# Patient Record
Sex: Male | Born: 1970 | Hispanic: No
Health system: Southern US, Community
[De-identification: ages and names within clinical notes are randomized; demographics above are authoritative.]

## PROBLEM LIST (undated history)

## (undated) DIAGNOSIS — K219 Gastro-esophageal reflux disease without esophagitis: Secondary | ICD-10-CM

## (undated) DIAGNOSIS — I10 Essential (primary) hypertension: Secondary | ICD-10-CM

## (undated) DIAGNOSIS — G629 Polyneuropathy, unspecified: Secondary | ICD-10-CM

## (undated) DIAGNOSIS — E785 Hyperlipidemia, unspecified: Secondary | ICD-10-CM

## (undated) HISTORY — DX: Hyperlipidemia, unspecified: E78.5

## (undated) HISTORY — DX: Essential (primary) hypertension: I10

## (undated) HISTORY — DX: Polyneuropathy, unspecified: G62.9

## (undated) HISTORY — DX: Gastro-esophageal reflux disease without esophagitis: K21.9

---

## 1999-11-13 ENCOUNTER — Encounter: Payer: Self-pay | Admitting: Specialist

## 1999-11-13 ENCOUNTER — Encounter: Admission: RE | Admit: 1999-11-13 | Discharge: 1999-11-13 | Payer: Self-pay | Admitting: Specialist

## 2000-07-04 ENCOUNTER — Encounter: Payer: Self-pay | Admitting: Emergency Medicine

## 2000-07-04 ENCOUNTER — Emergency Department (HOSPITAL_COMMUNITY): Admission: EM | Admit: 2000-07-04 | Discharge: 2000-07-04 | Payer: Self-pay | Admitting: Emergency Medicine

## 2003-11-30 HISTORY — PX: KNEE SURGERY: SHX244

## 2004-09-22 ENCOUNTER — Encounter: Admission: RE | Admit: 2004-09-22 | Discharge: 2004-09-22 | Payer: Self-pay | Admitting: Internal Medicine

## 2004-12-22 ENCOUNTER — Ambulatory Visit: Payer: Self-pay | Admitting: Internal Medicine

## 2005-03-01 ENCOUNTER — Ambulatory Visit: Payer: Self-pay | Admitting: Internal Medicine

## 2007-04-07 ENCOUNTER — Ambulatory Visit: Payer: Self-pay | Admitting: Internal Medicine

## 2007-05-08 ENCOUNTER — Ambulatory Visit: Payer: Self-pay | Admitting: Internal Medicine

## 2007-05-29 DIAGNOSIS — I10 Essential (primary) hypertension: Secondary | ICD-10-CM | POA: Insufficient documentation

## 2007-06-29 ENCOUNTER — Ambulatory Visit: Payer: Self-pay | Admitting: Internal Medicine

## 2007-06-29 DIAGNOSIS — E119 Type 2 diabetes mellitus without complications: Secondary | ICD-10-CM | POA: Insufficient documentation

## 2007-06-29 DIAGNOSIS — F528 Other sexual dysfunction not due to a substance or known physiological condition: Secondary | ICD-10-CM | POA: Insufficient documentation

## 2007-06-29 DIAGNOSIS — E785 Hyperlipidemia, unspecified: Secondary | ICD-10-CM | POA: Insufficient documentation

## 2007-06-29 LAB — CONVERTED CEMR LAB
Cholesterol, target level: 200 mg/dL
HDL goal, serum: 40 mg/dL
LDL Goal: 100 mg/dL

## 2007-07-04 LAB — CONVERTED CEMR LAB
BUN: 9 mg/dL (ref 6–23)
CO2: 29 meq/L (ref 19–32)
Calcium: 9.7 mg/dL (ref 8.4–10.5)
Chloride: 102 meq/L (ref 96–112)
Cholesterol: 240 mg/dL (ref 0–200)
Creatinine, Ser: 0.9 mg/dL (ref 0.4–1.5)
GFR calc Af Amer: 123 mL/min
GFR calc non Af Amer: 101 mL/min
Glucose, Bld: 147 mg/dL — ABNORMAL HIGH (ref 70–99)
HDL: 35.3 mg/dL — ABNORMAL LOW (ref >39.0)
Hgb A1c MFr Bld: 8 % — ABNORMAL HIGH (ref 4.6–6.0)
Potassium: 4 meq/L (ref 3.5–5.1)
Sodium: 138 meq/L (ref 135–145)
Testosterone: 393.71 ng/dL (ref 350.00–890)
Total CHOL/HDL Ratio: 6.8
Triglycerides: 84 mg/dL (ref 0–149)
VLDL: 17 mg/dL (ref 0–40)

## 2007-07-24 ENCOUNTER — Encounter: Admission: RE | Admit: 2007-07-24 | Discharge: 2007-10-22 | Payer: Self-pay | Admitting: Internal Medicine

## 2007-07-24 ENCOUNTER — Encounter: Payer: Self-pay | Admitting: Internal Medicine

## 2007-08-28 ENCOUNTER — Encounter: Payer: Self-pay | Admitting: Internal Medicine

## 2008-05-20 ENCOUNTER — Encounter: Payer: Self-pay | Admitting: Internal Medicine

## 2008-06-17 ENCOUNTER — Telehealth: Payer: Self-pay | Admitting: Internal Medicine

## 2008-10-21 ENCOUNTER — Encounter: Payer: Self-pay | Admitting: Internal Medicine

## 2009-08-13 ENCOUNTER — Ambulatory Visit: Payer: Self-pay | Admitting: Internal Medicine

## 2009-08-18 LAB — CONVERTED CEMR LAB
ALT: 26 units/L (ref 0–53)
AST: 25 units/L (ref 0–37)
Albumin: 4.3 g/dL (ref 3.5–5.2)
Alkaline Phosphatase: 70 units/L (ref 39–117)
BUN: 15 mg/dL (ref 6–23)
Bilirubin, Direct: 0.2 mg/dL (ref 0.0–0.3)
CO2: 31 meq/L (ref 19–32)
Calcium: 9.7 mg/dL (ref 8.4–10.5)
Chloride: 101 meq/L (ref 96–112)
Cholesterol: 250 mg/dL — ABNORMAL HIGH (ref 0–200)
Creatinine, Ser: 0.8 mg/dL (ref 0.4–1.5)
Direct LDL: 183.7 mg/dL
GFR calc non Af Amer: 114.79 mL/min (ref >60)
Glucose, Bld: 150 mg/dL — ABNORMAL HIGH (ref 70–99)
HDL: 55.5 mg/dL (ref >39.00)
Hgb A1c MFr Bld: 7.1 % — ABNORMAL HIGH (ref 4.6–6.5)
Potassium: 4.3 meq/L (ref 3.5–5.1)
Sodium: 139 meq/L (ref 135–145)
Total Bilirubin: 0.9 mg/dL (ref 0.3–1.2)
Total CHOL/HDL Ratio: 5
Total Protein: 8.2 g/dL (ref 6.0–8.3)
Triglycerides: 103 mg/dL (ref 0.0–149.0)
VLDL: 20.6 mg/dL (ref 0.0–40.0)

## 2010-09-01 ENCOUNTER — Ambulatory Visit: Payer: Self-pay | Admitting: Internal Medicine

## 2010-09-07 LAB — CONVERTED CEMR LAB
ALT: 26 units/L (ref 0–53)
BUN: 16 mg/dL (ref 6–23)
CO2: 28 meq/L (ref 19–32)
Calcium: 9.5 mg/dL (ref 8.4–10.5)
Chloride: 102 meq/L (ref 96–112)
Cholesterol: 226 mg/dL — ABNORMAL HIGH (ref 0–200)
Creatinine, Ser: 0.9 mg/dL (ref 0.4–1.5)
Direct LDL: 166.9 mg/dL
GFR calc non Af Amer: 100.95 mL/min (ref >60)
Glucose, Bld: 133 mg/dL — ABNORMAL HIGH (ref 70–99)
HDL: 50.3 mg/dL (ref >39.00)
Hgb A1c MFr Bld: 7.6 % — ABNORMAL HIGH (ref 4.6–6.5)
Potassium: 3.9 meq/L (ref 3.5–5.1)
Sodium: 138 meq/L (ref 135–145)
Total CHOL/HDL Ratio: 4
Triglycerides: 120 mg/dL (ref 0.0–149.0)
VLDL: 24 mg/dL (ref 0.0–40.0)

## 2010-09-22 ENCOUNTER — Encounter: Payer: Self-pay | Admitting: Internal Medicine

## 2010-12-08 ENCOUNTER — Other Ambulatory Visit: Payer: Self-pay | Admitting: Internal Medicine

## 2010-12-08 ENCOUNTER — Ambulatory Visit
Admission: RE | Admit: 2010-12-08 | Discharge: 2010-12-08 | Payer: Self-pay | Source: Home / Self Care | Attending: Internal Medicine | Admitting: Internal Medicine

## 2010-12-08 LAB — BASIC METABOLIC PANEL
BUN: 19 mg/dL (ref 6–23)
CO2: 31 mEq/L (ref 19–32)
Calcium: 9.7 mg/dL (ref 8.4–10.5)
Chloride: 99 mEq/L (ref 96–112)
Creatinine, Ser: 0.9 mg/dL (ref 0.4–1.5)
GFR: 103.49 mL/min (ref >60.00)
Glucose, Bld: 172 mg/dL — ABNORMAL HIGH (ref 70–99)
Potassium: 4.2 mEq/L (ref 3.5–5.1)
Sodium: 139 mEq/L (ref 135–145)

## 2010-12-08 LAB — HEPATIC FUNCTION PANEL
ALT: 43 U/L (ref 0–53)
AST: 29 U/L (ref 0–37)
Albumin: 4.2 g/dL (ref 3.5–5.2)
Alkaline Phosphatase: 87 U/L (ref 39–117)
Bilirubin, Direct: 0.1 mg/dL (ref 0.0–0.3)
Total Bilirubin: 0.8 mg/dL (ref 0.3–1.2)
Total Protein: 7.5 g/dL (ref 6.0–8.3)

## 2010-12-08 LAB — LIPID PANEL
Cholesterol: 165 mg/dL (ref 0–200)
HDL: 48.8 mg/dL (ref >39.00)
LDL Cholesterol: 99 mg/dL (ref 0–99)
Total CHOL/HDL Ratio: 3
Triglycerides: 84 mg/dL (ref 0.0–149.0)
VLDL: 16.8 mg/dL (ref 0.0–40.0)

## 2010-12-08 LAB — HEMOGLOBIN A1C: Hgb A1c MFr Bld: 8.6 % — ABNORMAL HIGH (ref 4.6–6.5)

## 2010-12-23 ENCOUNTER — Ambulatory Visit
Admission: RE | Admit: 2010-12-23 | Discharge: 2010-12-23 | Payer: Self-pay | Source: Home / Self Care | Attending: Internal Medicine | Admitting: Internal Medicine

## 2010-12-29 NOTE — Letter (Signed)
Summary: Eye Exam/[facility]  [facility]   Imported By: Maryln Gottron 10/09/2010 13:28:57  _____________________________________________________________________  External Attachment:    Type:   Image     Comment:   External Document

## 2010-12-29 NOTE — Assessment & Plan Note (Signed)
Summary: fup dm//ccm   Vital Signs:  Patient profile:   40 year old male Weight:      201 pounds Temp:     98.9 degrees F oral BP sitting:   110 / 82  (left arm) Cuff size:   large  Vitals Entered By: Sid Falcon LPN (September 01, 2010 10:45 AM)  Allergies (verified): No Known Drug Allergies  Past History:  Past Medical History: Last updated: 06/29/2007 Hypertension Diabetes mellitus, type II Hyperlipidemia  Social History: Last updated: 06/29/2007 Married Never Smoked Regular exercise-no  Family History: father CAD - age 62, CABG deceased age 47 Mother- heart trouble/CAD sister with DM 1  Physical Exam  General:  the well-developed well-nourished male in no acute distress. Moderately overweight. HEENT exam atraumatic, normocephalic symmetric muscles are intact. Neck is supple without lymphadenopathy or thyromegaly. Chest good auscultation without increased work of breathing. Cardiac exam S1-S2 are regular. Abdominal exam bowel sounds, soft, nontender. Extremities there is no clubbing cyanosis or edema. Neurologic exam is alert and oriented.   Impression & Recommendations:  Problem # 1:  HYPERLIPIDEMIA (ICD-272.4)  Labs Reviewed: SGOT: 25 (08/13/2009)   SGPT: 26 (08/13/2009)  Lipid Goals: Chol Goal: 200 (06/29/2007)   HDL Goal: 40 (06/29/2007)   LDL Goal: 100 (06/29/2007)   TG Goal: 150 (06/29/2007)  Prior 10 Yr Risk Heart Disease: Not enough information (06/29/2007)   HDL:55.50 (08/13/2009), 35.3 (06/29/2007)  LDL:DEL (06/29/2007)  Chol:250 (08/13/2009), 240 (06/29/2007)  Trig:103.0 (08/13/2009), 84 (06/29/2007)  His updated medication list for this problem includes:    Simvastatin 20 Mg Tabs (Simvastatin) .Marland Kitchen... 1 by mouth at bedtime  Problem # 2:  DIABETES MELLITUS, TYPE II (ICD-250.00) needs f/u labs His updated medication list for this problem includes:    Lisinopril-hydrochlorothiazide 20-25 Mg Tabs (Lisinopril-hydrochlorothiazide) .Marland Kitchen... Take 1 tablet  by mouth once a day or as directed  Labs Reviewed: Creat: 0.8 (08/13/2009)    Reviewed HgBA1c results: 7.1 (08/13/2009)  8.0 (06/29/2007)     Problem # 3:  HYPERTENSION (ICD-401.9) controlled continue current medications  His updated medication list for this problem includes:    Lisinopril-hydrochlorothiazide 20-25 Mg Tabs (Lisinopril-hydrochlorothiazide) .Marland Kitchen... Take 1 tablet by mouth once a day or as directed  BP today: 110/82 Prior BP: 110/78 (08/13/2009)  Prior 10 Yr Risk Heart Disease: Not enough information (06/29/2007)  Labs Reviewed: K+: 4.3 (08/13/2009) Creat: : 0.8 (08/13/2009)   Chol: 250 (08/13/2009)   HDL: 55.50 (08/13/2009)   LDL: DEL (06/29/2007)   TG: 103.0 (08/13/2009)  Orders: Venipuncture (52841) Specimen Handling (32440)  Complete Medication List: 1)  Lisinopril-hydrochlorothiazide 20-25 Mg Tabs (Lisinopril-hydrochlorothiazide) .... Take 1 tablet by mouth once a day or as directed 2)  Vitamin C 500 Mg Tabs (Ascorbic acid) .... Take 1 once a day 3)  Vitamin B-12 250 Mcg Tabs (Cyanocobalamin) .... Once daily 4)  Diprolene Af 0.05 % Crea (Betamethasone dipropionate aug) .... Apply small amount to affected area two times a day as needed 5)  Simvastatin 20 Mg Tabs (Simvastatin) .Marland Kitchen.. 1 by mouth at bedtime  Other Orders: Admin 1st Vaccine ([address]) Flu Vaccine 70yrs + (53664) TLB-A1C / Hgb A1C (Glycohemoglobin) (83036-A1C) TLB-BMP (Basic Metabolic Panel-BMET) (80048-METABOL) TLB-Lipid Panel (80061-LIPID) TLB-ALT (SGPT) (84460-ALT)  Patient Instructions: 1)  Please schedule a follow-up appointment in 3 months. 2)  labs one week prior to visit 3)  lipids---272.4 4)  lfts-995.2 5)  bmet-995.2 6)  A1C-250.02 7)     Prescriptions: LISINOPRIL-HYDROCHLOROTHIAZIDE 20-25 MG  TABS (LISINOPRIL-HYDROCHLOROTHIAZIDE) Take 1 tablet by  mouth once a day or as directed  #90 x 3   Entered and Authorized by:   Birdie Sons MD   Signed by:   Birdie Sons MD on 09/01/2010    Method used:   Electronically to        [facility] [phone]* (retail)       [address]       [place], [place]  [address]       Ph: [phone]       Fax: [phone]   RxID:   [id number] SIMVASTATIN 20 MG TABS (SIMVASTATIN) 1 by mouth at bedtime  #90 x 3   Entered and Authorized by:   Birdie Sons MD   Signed by:   Birdie Sons MD on 09/01/2010   Method used:   Electronically to        [facility] [phone]* (retail)       [address]       [place], [place]  [address]       Ph: [phone]       Fax: [phone]   RxID:   [id number] SIMVASTATIN 20 MG TABS (SIMVASTATIN) 1 by mouth at bedtime  #90 x 3   Entered and Authorized by:   Birdie Sons MD   Signed by:   Birdie Sons MD on 09/01/2010   Method used:   Electronically to        [facility] [address] (retail)       [address]       [place], [place]  [address]       Ph: [phone]       Fax: [phone]   RxID:   [id number]      Flu Vaccine Consent Questions     Do you have a history of severe allergic reactions to this vaccine? no    Any prior history of allergic reactions to egg and/or gelatin? no    Do you have a sensitivity to the preservative Thimersol? no    Do you have a past history of Guillan-Barre Syndrome? no    Do you currently have an acute febrile illness? no    Have you ever had a severe reaction to latex? no    Vaccine information given and explained to patient? yes    Are you currently pregnant? no    Lot Number:[id number]   Exp Date:05/29/2011   Site Given  Left Deltoid IMlu

## 2010-12-31 NOTE — Assessment & Plan Note (Signed)
Summary: 3 MO ROV/CB/pt rsc from bmp/cjr   Vital Signs:  Patient profile:   40 year old male Weight:      213 pounds BMI:     28.99 Temp:     98.3 degrees F oral BP sitting:   114 / 82  (left arm) Cuff size:   large  Vitals Entered By: Alfred Levins, CMA (December 23, 2010 12:13 PM) CC: discuss labs   CC:  discuss labs.  Current Medications (verified): 1)  Lisinopril-Hydrochlorothiazide 20-25 Mg  Tabs (Lisinopril-Hydrochlorothiazide) .... Take 1 Tablet By Mouth Once A Day or As Directed 2)  Vitamin C 500 Mg Tabs (Ascorbic Acid) .... Take 1 Once A Day 3)  Diprolene Af 0.05 % Crea (Betamethasone Dipropionate Aug) .... Apply Small Amount To Affected Area Two Times A Day As Needed 4)  Simvastatin 20 Mg Tabs (Simvastatin) .Marland Kitchen.. 1 By Mouth At Bedtime  Allergies (verified): No Known Drug Allergies  Family History: father CAD - age 83, CABG deceased age 40 (from brain hemorrhage) Mother- heart trouble/CAD, borderline DM  sister with DM 1  Physical Exam  General:   moderately overweight male in no acute distress. HEENT exam atraumatic, normocephalic, neck supple. Chest clear to auscultation cardiac exam S1-S2 are regular. Abdominal exam abdomen sounds, soft, overweight. extretities no edema. Neurologic exam alert with a normal gait.   Impression & Recommendations:  Problem # 1:  DIABETES MELLITUS, TYPE II (ICD-250.00)  prolonged discussion regarding diabetes. Patient's A1c has gone up as would be expected with recent weight gain. A long discussion with him about diet, exercise, weight loss. The key to long-term success with weight loss. Will avoid medications for now. A 15 pound weight loss in the next 3 months. Total time with the patient over 45 minutes. Greater than have spent counseling regarding diabetes. His updated medication list for this problem includes:    Lisinopril-hydrochlorothiazide 20-25 Mg Tabs (Lisinopril-hydrochlorothiazide) .Marland Kitchen... Take 1 tablet by mouth once a day or  as directed  Labs Reviewed: Creat: 0.9 (12/08/2010)    Reviewed HgBA1c results: 8.6 (12/08/2010)  7.6 (09/01/2010)  Problem # 2:  HYPERLIPIDEMIA (ICD-272.4)  controlled continue current medications  His updated medication list for this problem includes:    Simvastatin 20 Mg Tabs (Simvastatin) .Marland Kitchen... 1 by mouth at bedtime  Labs Reviewed: SGOT: 29 (12/08/2010)   SGPT: 43 (12/08/2010)  Lipid Goals: Chol Goal: 200 (06/29/2007)   HDL Goal: 40 (06/29/2007)   LDL Goal: 100 (06/29/2007)   TG Goal: 150 (06/29/2007)  Prior 10 Yr Risk Heart Disease: Not enough information (06/29/2007)   HDL:48.80 (12/08/2010), 50.30 (09/01/2010)  LDL:99 (12/08/2010), DEL (14/43/1540)  Chol:165 (12/08/2010), 226 (09/01/2010)  Trig:84.0 (12/08/2010), 120.0 (09/01/2010)  Problem # 3:  HYPERTENSION (ICD-401.9) controlled continue current medications  His updated medication list for this problem includes:    Lisinopril-hydrochlorothiazide 20-25 Mg Tabs (Lisinopril-hydrochlorothiazide) .Marland Kitchen... Take 1 tablet by mouth once a day or as directed  BP today: 114/82 Prior BP: 110/82 (09/01/2010)  Prior 10 Yr Risk Heart Disease: Not enough information (06/29/2007)  Labs Reviewed: K+: 4.2 (12/08/2010) Creat: : 0.9 (12/08/2010)   Chol: 165 (12/08/2010)   HDL: 48.80 (12/08/2010)   LDL: 99 (12/08/2010)   TG: 84.0 (12/08/2010)  Complete Medication List: 1)  Lisinopril-hydrochlorothiazide 20-25 Mg Tabs (Lisinopril-hydrochlorothiazide) .... Take 1 tablet by mouth once a day or as directed 2)  Vitamin C 500 Mg Tabs (Ascorbic acid) .... Take 1 once a day 3)  Diprolene Af 0.05 % Crea (Betamethasone dipropionate aug) .... Apply  small amount to affected area two times a day as needed 4)  Simvastatin 20 Mg Tabs (Simvastatin) .Marland Kitchen.. 1 by mouth at bedtime  Patient Instructions: 1)  Please schedule a follow-up appointment in 3 months. 2)  labs one week prior to visit 3)  lipids---272.4 4)  lfts-995.2 5)  bmet-995.2 6)   A1C-250.02 7)     8)  It is important that you exercise regularly at least 40 minutes 5 times a week. If you develop chest pain, have severe difficulty breathing, or feel very tired , stop exercising immediately and seek medical attention.   Orders Added: 1)  Est. Patient Level V [16109]

## 2011-03-17 ENCOUNTER — Other Ambulatory Visit (INDEPENDENT_AMBULATORY_CARE_PROVIDER_SITE_OTHER): Payer: BC Managed Care – PPO | Admitting: Internal Medicine

## 2011-03-17 DIAGNOSIS — T887XXA Unspecified adverse effect of drug or medicament, initial encounter: Secondary | ICD-10-CM

## 2011-03-17 DIAGNOSIS — E785 Hyperlipidemia, unspecified: Secondary | ICD-10-CM

## 2011-03-17 DIAGNOSIS — IMO0001 Reserved for inherently not codable concepts without codable children: Secondary | ICD-10-CM

## 2011-03-17 LAB — LIPID PANEL
Cholesterol: 140 mg/dL (ref 0–200)
HDL: 49.9 mg/dL
LDL Cholesterol: 74 mg/dL (ref 0–99)
Total CHOL/HDL Ratio: 3
Triglycerides: 80 mg/dL (ref 0.0–149.0)
VLDL: 16 mg/dL (ref 0.0–40.0)

## 2011-03-17 LAB — BASIC METABOLIC PANEL WITH GFR
BUN: 16 mg/dL (ref 6–23)
CO2: 32 meq/L (ref 19–32)
Calcium: 9.6 mg/dL (ref 8.4–10.5)
Chloride: 103 meq/L (ref 96–112)
Creatinine, Ser: 0.9 mg/dL (ref 0.4–1.5)
GFR: 94.51 mL/min
Glucose, Bld: 136 mg/dL — ABNORMAL HIGH (ref 70–99)
Potassium: 4.2 meq/L (ref 3.5–5.1)
Sodium: 141 meq/L (ref 135–145)

## 2011-03-17 LAB — HEPATIC FUNCTION PANEL
ALT: 25 U/L (ref 0–53)
AST: 22 U/L (ref 0–37)
Alkaline Phosphatase: 72 U/L (ref 39–117)
Total Bilirubin: 0.7 mg/dL (ref 0.3–1.2)

## 2011-03-29 ENCOUNTER — Encounter: Payer: Self-pay | Admitting: Internal Medicine

## 2011-03-29 ENCOUNTER — Ambulatory Visit (INDEPENDENT_AMBULATORY_CARE_PROVIDER_SITE_OTHER): Payer: BC Managed Care – PPO | Admitting: Internal Medicine

## 2011-03-29 DIAGNOSIS — I10 Essential (primary) hypertension: Secondary | ICD-10-CM

## 2011-03-29 DIAGNOSIS — E119 Type 2 diabetes mellitus without complications: Secondary | ICD-10-CM

## 2011-03-29 DIAGNOSIS — E785 Hyperlipidemia, unspecified: Secondary | ICD-10-CM

## 2011-03-29 NOTE — Progress Notes (Signed)
  Subjective:    Patient ID: Danny Schroeder, male    DOB: 06/04/1971, 40 y.o.   MRN: [id number]  HPI   patient comes in for followup of multiple medical problems including type 2 diabetes, hyperlipidemia, hypertension. The patient does not check blood sugar or blood pressure at home. The patetient does not follow an exercise or diet program. The patient denies any polyuria, polydipsia.  In the past the patient has gone to diabetic treatment center. The patient is tolerating medications  Without difficulty. The patient does admit to medication compliance. Has not required DM meds recently He is trying to lose weight  Past Medical History  Diagnosis Date  . Hyperlipidemia   . Hypertension   . Diabetes mellitus    No past surgical history on file.  reports that he has never smoked. He does not have any smokeless tobacco history on file. His alcohol and drug histories not on file. family history includes Diabetes in his mother and sister; Heart disease in his mother; and Heart disease (age of onset:39) in his father. No Known Allergies   Review of Systems  patient denies chest pain, shortness of breath, orthopnea. Denies lower extremity edema, abdominal pain, change in appetite, change in bowel movements. Patient denies rashes, musculoskeletal complaints. No other specific complaints in a complete review of systems.      Objective:   Physical Exam  well-developed well-nourished male in no acute distress. HEENT exam atraumatic, normocephalic, neck supple without jugular venous distention. Chest clear to auscultation cardiac exam S1-S2 are regular. Abdominal exam overweight with bowel sounds, soft and nontender. Extremities no edema. Neurologic exam is alert with a normal gait.      Assessment & Plan:

## 2011-03-29 NOTE — Assessment & Plan Note (Signed)
Controlled Continue meds 

## 2011-03-29 NOTE — Assessment & Plan Note (Signed)
Improved Will continue to avoid meds He understands that he needs to lose about 20 pounds (at least) He may still end up on DM meds

## 2011-07-07 ENCOUNTER — Other Ambulatory Visit (INDEPENDENT_AMBULATORY_CARE_PROVIDER_SITE_OTHER): Payer: BC Managed Care – PPO

## 2011-07-07 DIAGNOSIS — I10 Essential (primary) hypertension: Secondary | ICD-10-CM

## 2011-07-07 DIAGNOSIS — E119 Type 2 diabetes mellitus without complications: Secondary | ICD-10-CM

## 2011-07-07 DIAGNOSIS — E785 Hyperlipidemia, unspecified: Secondary | ICD-10-CM

## 2011-07-07 LAB — BASIC METABOLIC PANEL
Calcium: 9.3 mg/dL (ref 8.4–10.5)
GFR: 97.97 mL/min (ref >60.00)
Sodium: 139 mEq/L (ref 135–145)

## 2011-07-07 LAB — LIPID PANEL
Cholesterol: 170 mg/dL (ref 0–200)
Total CHOL/HDL Ratio: 3
Triglycerides: 100 mg/dL (ref 0.0–149.0)

## 2011-07-07 LAB — HEMOGLOBIN A1C: Hgb A1c MFr Bld: 8.2 % — ABNORMAL HIGH (ref 4.6–6.5)

## 2011-07-07 LAB — HEPATIC FUNCTION PANEL
ALT: 27 U/L (ref 0–53)
AST: 23 U/L (ref 0–37)
Albumin: 4.2 g/dL (ref 3.5–5.2)
Alkaline Phosphatase: 64 U/L (ref 39–117)

## 2011-08-25 ENCOUNTER — Encounter: Payer: Self-pay | Admitting: Internal Medicine

## 2011-08-25 ENCOUNTER — Ambulatory Visit (INDEPENDENT_AMBULATORY_CARE_PROVIDER_SITE_OTHER): Payer: BC Managed Care – PPO | Admitting: Internal Medicine

## 2011-08-25 VITALS — BP 102/72 | HR 94 | Temp 98.4°F | Ht 72.0 in | Wt 208.0 lb

## 2011-08-25 DIAGNOSIS — Z0000 Encounter for general adult medical examination without abnormal findings: Secondary | ICD-10-CM

## 2011-08-25 DIAGNOSIS — Z23 Encounter for immunization: Secondary | ICD-10-CM

## 2011-08-25 DIAGNOSIS — E119 Type 2 diabetes mellitus without complications: Secondary | ICD-10-CM

## 2011-08-25 MED ORDER — METFORMIN HCL 500 MG PO TABS: 500.0000 mg | ORAL_TABLET | Freq: Two times a day (BID) | ORAL | Status: DC

## 2011-08-25 NOTE — Assessment & Plan Note (Signed)
Poor control. Patient also poor diet. He is exercising some. I told him he needs to lose 20-30 pounds. He should follow a low calorie, low carbohydrate diet. He should essentially eliminates simple carbohydrates from his diet. I will see him back in 4 months.

## 2011-08-25 NOTE — Progress Notes (Signed)
  Subjective:    Patient ID: Danny Schroeder, male    DOB: August 15, 1971, 40 y.o.   MRN: [id number]  HPI  Well visit  Past Medical History  Diagnosis Date  . Hyperlipidemia   . Hypertension   . Diabetes mellitus    No past surgical history on file.  reports that he has never smoked. He does not have any smokeless tobacco history on file. His alcohol and drug histories not on file. family history includes Diabetes in his mother and sister; Heart disease in his mother; and Heart disease (age of onset:39) in his father. No Known Allergies  Review of Systems  patient denies chest pain, shortness of breath, orthopnea. Denies lower extremity edema, abdominal pain, change in appetite, change in bowel movements. Patient denies rashes, musculoskeletal complaints. No other specific complaints in a complete review of systems.      Objective:   Physical Exam   well-developed well-nourished male in no acute distress. HEENT exam atraumatic, normocephalic, neck supple without jugular venous distention. Chest clear to auscultation cardiac exam S1-S2 are regular. Abdominal exam overweight with bowel sounds, soft and nontender. Extremities no edema. Neurologic exam is alert with a normal gait.     Assessment & Plan:  Well visit---health maint UTD

## 2011-09-06 ENCOUNTER — Other Ambulatory Visit: Payer: Self-pay | Admitting: *Deleted

## 2011-09-06 MED ORDER — SIMVASTATIN 20 MG PO TABS: 20.0000 mg | ORAL_TABLET | Freq: Every day | ORAL | Status: DC

## 2011-10-18 ENCOUNTER — Other Ambulatory Visit: Payer: Self-pay | Admitting: *Deleted

## 2011-10-18 MED ORDER — LISINOPRIL-HYDROCHLOROTHIAZIDE 20-25 MG PO TABS: 1.0000 | ORAL_TABLET | Freq: Every day | ORAL | Status: DC

## 2011-12-22 ENCOUNTER — Other Ambulatory Visit (INDEPENDENT_AMBULATORY_CARE_PROVIDER_SITE_OTHER): Payer: BC Managed Care – PPO

## 2011-12-22 DIAGNOSIS — E119 Type 2 diabetes mellitus without complications: Secondary | ICD-10-CM

## 2011-12-22 LAB — HEPATIC FUNCTION PANEL
Alkaline Phosphatase: 67 U/L (ref 39–117)
Bilirubin, Direct: 0.1 mg/dL (ref 0.0–0.3)
Total Protein: 7.6 g/dL (ref 6.0–8.3)

## 2011-12-22 LAB — BASIC METABOLIC PANEL
CO2: 29 mEq/L (ref 19–32)
Calcium: 9.6 mg/dL (ref 8.4–10.5)
Creatinine, Ser: 0.9 mg/dL (ref 0.4–1.5)

## 2011-12-22 LAB — LIPID PANEL
Cholesterol: 163 mg/dL (ref 0–200)
LDL Cholesterol: 99 mg/dL (ref 0–99)
Total CHOL/HDL Ratio: 3

## 2012-01-04 ENCOUNTER — Encounter: Payer: Self-pay | Admitting: Internal Medicine

## 2012-01-04 ENCOUNTER — Ambulatory Visit (INDEPENDENT_AMBULATORY_CARE_PROVIDER_SITE_OTHER): Payer: BC Managed Care – PPO | Admitting: Internal Medicine

## 2012-01-04 VITALS — BP 124/74 | HR 72 | Temp 98.3°F | Wt 211.0 lb

## 2012-01-04 DIAGNOSIS — IMO0002 Reserved for concepts with insufficient information to code with codable children: Secondary | ICD-10-CM

## 2012-01-04 DIAGNOSIS — E1165 Type 2 diabetes mellitus with hyperglycemia: Secondary | ICD-10-CM

## 2012-01-04 DIAGNOSIS — IMO0001 Reserved for inherently not codable concepts without codable children: Secondary | ICD-10-CM

## 2012-01-04 NOTE — Assessment & Plan Note (Signed)
DM still not adequately controlled. Needs to lose weight Exercise every day Low calorie diet.

## 2012-01-04 NOTE — Progress Notes (Signed)
Patient ID: Danny Schroeder, male   DOB: 27-May-1971, 41 y.o.   MRN: [id number]  patient comes in for followup of multiple medical problems including type 2 diabetes, hyperlipidemia, hypertension. The patient does not check blood sugar or blood pressure at home. The patetient does not follow an exercise or diet program. The patient denies any polyuria, polydipsia.  In the past the patient has gone to diabetic treatment center. The patient is tolerating medications  Without difficulty. The patient does admit to medication compliance.    Past Medical History  Diagnosis Date  . Hyperlipidemia   . Hypertension   . Diabetes mellitus     History   Social History  . Marital Status: Married    Spouse Name: N/A    Number of Children: N/A  . Years of Education: N/A   Occupational History  . Not on file.   Social History Main Topics  . Smoking status: Never Smoker   . Smokeless tobacco: Not on file  . Alcohol Use:   . Drug Use:   . Sexually Active:    Other Topics Concern  . Not on file   Social History Narrative  . No narrative on file    No past surgical history on file.  Family History  Problem Relation Age of Onset  . Heart disease Mother   . Diabetes Mother   . Heart disease Father 51    CABG  . Diabetes Sister     No Known Allergies  Current Outpatient Prescriptions on File Prior to Visit  Medication Sig Dispense Refill  . Ascorbic Acid (VITAMIN C) 500 MG tablet Take 500 mg by mouth daily.        Marland Kitchen lisinopril-hydrochlorothiazide (PRINZIDE,ZESTORETIC) 20-25 MG per tablet Take 1 tablet by mouth daily.  90 tablet  1  . metFORMIN (GLUCOPHAGE) 500 MG tablet Take 1 tablet (500 mg total) by mouth 2 (two) times daily with a meal.  180 tablet  3  . simvastatin (ZOCOR) 20 MG tablet Take 1 tablet (20 mg total) by mouth at bedtime.  90 tablet  3     patient denies chest pain, shortness of breath, orthopnea. Denies lower extremity edema, abdominal pain, change in appetite, change in  bowel movements. Patient denies rashes, musculoskeletal complaints. No other specific complaints in a complete review of systems.   BP 124/74  Pulse 72  Temp(Src) 98.3 F (36.8 C) (Oral)  Wt 211 lb (95.709 kg)  well-developed well-nourished male in no acute distress. HEENT exam atraumatic, normocephalic, neck supple without jugular venous distention. Chest clear to auscultation cardiac exam S1-S2 are regular. Abdominal exam overweight with bowel sounds, soft and nontender. Extremities no edema. Neurologic exam is alert with a normal gait.

## 2012-04-25 ENCOUNTER — Other Ambulatory Visit (INDEPENDENT_AMBULATORY_CARE_PROVIDER_SITE_OTHER): Payer: BC Managed Care – PPO

## 2012-04-25 ENCOUNTER — Other Ambulatory Visit: Payer: Self-pay | Admitting: *Deleted

## 2012-04-25 DIAGNOSIS — I10 Essential (primary) hypertension: Secondary | ICD-10-CM

## 2012-04-25 DIAGNOSIS — IMO0001 Reserved for inherently not codable concepts without codable children: Secondary | ICD-10-CM

## 2012-04-25 DIAGNOSIS — E1165 Type 2 diabetes mellitus with hyperglycemia: Secondary | ICD-10-CM

## 2012-04-25 DIAGNOSIS — IMO0002 Reserved for concepts with insufficient information to code with codable children: Secondary | ICD-10-CM

## 2012-04-25 LAB — BASIC METABOLIC PANEL
BUN: 15 mg/dL (ref 6–23)
CO2: 27 mEq/L (ref 19–32)
Chloride: 103 mEq/L (ref 96–112)
Creatinine, Ser: 0.9 mg/dL (ref 0.4–1.5)

## 2012-04-25 LAB — LIPID PANEL: Cholesterol: 164 mg/dL (ref 0–200)

## 2012-04-25 LAB — HEPATIC FUNCTION PANEL
ALT: 23 U/L (ref 0–53)
AST: 21 U/L (ref 0–37)
Albumin: 3.9 g/dL (ref 3.5–5.2)
Total Protein: 7.2 g/dL (ref 6.0–8.3)

## 2012-04-25 MED ORDER — LISINOPRIL-HYDROCHLOROTHIAZIDE 20-25 MG PO TABS: 1.0000 | ORAL_TABLET | Freq: Every day | ORAL | Status: DC

## 2012-04-28 ENCOUNTER — Encounter: Payer: Self-pay | Admitting: Internal Medicine

## 2012-04-28 ENCOUNTER — Ambulatory Visit (INDEPENDENT_AMBULATORY_CARE_PROVIDER_SITE_OTHER): Payer: BC Managed Care – PPO | Admitting: Internal Medicine

## 2012-04-28 ENCOUNTER — Telehealth: Payer: Self-pay

## 2012-04-28 VITALS — BP 110/70 | Temp 98.0°F | Wt 205.0 lb

## 2012-04-28 DIAGNOSIS — I10 Essential (primary) hypertension: Secondary | ICD-10-CM

## 2012-04-28 DIAGNOSIS — IMO0002 Reserved for concepts with insufficient information to code with codable children: Secondary | ICD-10-CM

## 2012-04-28 DIAGNOSIS — E1165 Type 2 diabetes mellitus with hyperglycemia: Secondary | ICD-10-CM

## 2012-04-28 DIAGNOSIS — IMO0001 Reserved for inherently not codable concepts without codable children: Secondary | ICD-10-CM

## 2012-04-28 MED ORDER — METFORMIN HCL 1000 MG PO TABS: 1000.0000 mg | ORAL_TABLET | Freq: Two times a day (BID) | ORAL | Status: DC

## 2012-04-28 NOTE — Progress Notes (Signed)
Subjective:    Patient ID: Danny Schroeder, male    DOB: [birth year], 41 y.o.   MRN: [id number]  HPI 41 year old patient who has a history of treated hypertension and diabetes. He was well until yesterday when he had the onset of cold sweats weakness and a general sense of unwellness. Blood sugars were slightly higher yesterday with a fasting blood sugar 130 and postprandial sugars in the 190-200 range. He noted his blood pressure to be in a low-normal range. No fever He is had recent laboratory studies that were reviewed. Hemoglobin A1c still 7.5. He is scheduled for followup in 4 days. Today he feels much improved  Past Medical History  Diagnosis Date  . Hyperlipidemia   . Hypertension   . Diabetes mellitus     History   Social History  . Marital Status: Married    Spouse Name: N/A    Number of Children: N/A  . Years of Education: N/A   Occupational History  . Not on file.   Social History Main Topics  . Smoking status: Never Smoker   . Smokeless tobacco: Not on file  . Alcohol Use:   . Drug Use:   . Sexually Active:    Other Topics Concern  . Not on file   Social History Narrative  . No narrative on file    No past surgical history on file.  Family History  Problem Relation Age of Onset  . Heart disease Mother   . Diabetes Mother   . Heart disease Father 23    CABG  . Diabetes Sister     No Known Allergies  Current Outpatient Prescriptions on File Prior to Visit  Medication Sig Dispense Refill  . Ascorbic Acid (VITAMIN C) 500 MG tablet Take 500 mg by mouth daily.        Marland Kitchen lisinopril-hydrochlorothiazide (PRINZIDE,ZESTORETIC) 20-25 MG per tablet Take 1 tablet by mouth daily.  90 tablet  1  . simvastatin (ZOCOR) 20 MG tablet Take 1 tablet (20 mg total) by mouth at bedtime.  90 tablet  3    BP 110/70  Temp(Src) 98 F (36.7 C) (Oral)  Wt 205 lb (92.987 kg)  SpO2 98%     Review of Systems  Constitutional: Positive for chills, activity change, appetite change  and fatigue. Negative for fever.  HENT: Negative for hearing loss, ear pain, congestion, sore throat, trouble swallowing, neck stiffness, dental problem, voice change and tinnitus.   Eyes: Negative for pain, discharge and visual disturbance.  Respiratory: Negative for cough, chest tightness, wheezing and stridor.   Cardiovascular: Negative for chest pain, palpitations and leg swelling.  Gastrointestinal: Negative for nausea, vomiting, abdominal pain, diarrhea, constipation, blood in stool and abdominal distention.  Genitourinary: Negative for urgency, hematuria, flank pain, discharge, difficulty urinating and genital sores.  Musculoskeletal: Negative for myalgias, back pain, joint swelling, arthralgias and gait problem.  Skin: Negative for rash.  Neurological: Positive for weakness. Negative for dizziness, syncope, speech difficulty, numbness and headaches.  Hematological: Negative for adenopathy. Does not bruise/bleed easily.  Psychiatric/Behavioral: Negative for behavioral problems and dysphoric mood. The patient is not nervous/anxious.        Objective:   Physical Exam  Constitutional: He is oriented to person, place, and time. He appears well-developed.       Blood pressure 110/80 without orthostatic change  HENT:  Head: Normocephalic.  Right Ear: External ear normal.  Left Ear: External ear normal.  Eyes: Conjunctivae and EOM are normal.  Neck: Normal range of  motion.  Cardiovascular: Normal rate and normal heart sounds.   Pulmonary/Chest: Breath sounds normal.  Abdominal: Bowel sounds are normal.  Musculoskeletal: Normal range of motion. He exhibits no edema and no tenderness.  Neurological: He is alert and oriented to person, place, and time.  Psychiatric: He has a normal mood and affect. His behavior is normal.          Assessment & Plan:   Probable viral syndrome. Today clinically is improved. Doubt significant hypotension is contributing  to his symptoms. Will hold  blood pressure medication tonight and resume on a normal schedule tomorrow. Will observe over the weekend. Hypertension well controlled Diabetes mellitus suboptimal control. He is tolerating metformin well without any GI issues. We'll increase metformin to 2 g daily in divided dosages.  Followup in 4 days as planned

## 2012-04-28 NOTE — Telephone Encounter (Signed)
appt scheduled with Dr K 

## 2012-04-28 NOTE — Telephone Encounter (Signed)
Patient called stating he didn't feel that good yesterday. Pt stated he had "cold sweats" yesterday slept for a few hours and felt better, he then went back to sleep for 6 hours and feels a little better but still not normal. Pt states his BP 102/70, 103/70, 101/70. His BS fasting was 130. Pt also stated he feels light headed. Patient would like to speak to Dr Cato Mulligan nurse. Pt states you can call home or cell

## 2012-04-28 NOTE — Patient Instructions (Addendum)
Office followup next week as planned Hold blood pressure medication today

## 2012-05-02 ENCOUNTER — Ambulatory Visit (INDEPENDENT_AMBULATORY_CARE_PROVIDER_SITE_OTHER): Payer: BC Managed Care – PPO | Admitting: Internal Medicine

## 2012-05-02 ENCOUNTER — Ambulatory Visit: Payer: BC Managed Care – PPO | Admitting: Internal Medicine

## 2012-05-02 ENCOUNTER — Encounter: Payer: Self-pay | Admitting: Internal Medicine

## 2012-05-02 VITALS — BP 114/84 | HR 68 | Temp 97.8°F | Wt 208.0 lb

## 2012-05-02 DIAGNOSIS — I10 Essential (primary) hypertension: Secondary | ICD-10-CM

## 2012-05-02 DIAGNOSIS — IMO0001 Reserved for inherently not codable concepts without codable children: Secondary | ICD-10-CM

## 2012-05-02 DIAGNOSIS — E785 Hyperlipidemia, unspecified: Secondary | ICD-10-CM

## 2012-05-02 DIAGNOSIS — E1165 Type 2 diabetes mellitus with hyperglycemia: Secondary | ICD-10-CM

## 2012-05-02 DIAGNOSIS — IMO0002 Reserved for concepts with insufficient information to code with codable children: Secondary | ICD-10-CM

## 2012-05-02 NOTE — Progress Notes (Signed)
Patient ID: Danny Schroeder, male   DOB: 1971/10/26, 41 y.o.   MRN: [id number]  patient comes in for followup of multiple medical problems including type 2 diabetes, hyperlipidemia, hypertension. The patient does not check blood sugar or blood pressure at home. The patetient does not follow an exercise or diet program. The patient denies any polyuria, polydipsia.  In the past the patient has gone to diabetic treatment center. The patient is tolerating medications  Without difficulty. The patient does admit to medication compliance.   Reviewed note from Friday--- he is feeling much better but he is not taking lisinopril currently.  Past Medical History  Diagnosis Date  . Hyperlipidemia   . Hypertension   . Diabetes mellitus     History   Social History  . Marital Status: Married    Spouse Name: N/A    Number of Children: N/A  . Years of Education: N/A   Occupational History  . Not on file.   Social History Main Topics  . Smoking status: Never Smoker   . Smokeless tobacco: Not on file  . Alcohol Use:   . Drug Use:   . Sexually Active:    Other Topics Concern  . Not on file   Social History Narrative  . No narrative on file    No past surgical history on file.  Family History  Problem Relation Age of Onset  . Heart disease Mother   . Diabetes Mother   . Heart disease Father 60    CABG  . Diabetes Sister     No Known Allergies  Current Outpatient Prescriptions on File Prior to Visit  Medication Sig Dispense Refill  . lisinopril-hydrochlorothiazide (PRINZIDE,ZESTORETIC) 20-25 MG per tablet Take 1 tablet by mouth daily.  90 tablet  1  . simvastatin (ZOCOR) 20 MG tablet Take 1 tablet (20 mg total) by mouth at bedtime.  90 tablet  3     patient denies chest pain, shortness of breath, orthopnea. Denies lower extremity edema, abdominal pain, change in appetite, change in bowel movements. Patient denies rashes, musculoskeletal complaints. No other specific complaints in a  complete review of systems.   BP 114/84  Pulse 68  Temp(Src) 97.8 F (36.6 C) (Oral)  Wt 208 lb (94.348 kg)  well-developed well-nourished male in no acute distress. HEENT exam atraumatic, normocephalic, neck supple without jugular venous distention. Chest clear to auscultation cardiac exam S1-S2 are regular. Abdominal exam overweight with bowel sounds, soft and nontender. Extremities no edema. Neurologic exam is alert with a normal gait.

## 2012-05-02 NOTE — Assessment & Plan Note (Signed)
Reasonable control  Continue meds 

## 2012-05-02 NOTE — Assessment & Plan Note (Signed)
He is not taking lisinopril---  bp well controlled currently I'll remove lisinopril from list

## 2012-05-02 NOTE — Assessment & Plan Note (Addendum)
Not as well controlled Metformin increased last Friday Continue the same  Discussed his ongoing risk of DM. He needs to lose weight, he needs to exercise. Initial goal weight<190 pounds

## 2012-05-24 ENCOUNTER — Telehealth: Payer: Self-pay | Admitting: Internal Medicine

## 2012-05-24 MED ORDER — LISINOPRIL 10 MG PO TABS: 10.0000 mg | ORAL_TABLET | Freq: Every day | ORAL | Status: DC

## 2012-05-24 NOTE — Telephone Encounter (Signed)
Caller: Ralphael/Patient; PCP: Birdie Sons; CB#: [phone]; Calling today 05/24/12 regarding Blood Pressure Issues; Was in office on 05/02/12 and at that MD took him off his blood pressure medicine b/c his BP was running to low.  Since that time, his BP has been starting to fluctuate.  If he goes 3-4 without his pill, his BP goes up.  Feels tightening in back of neck and head starts hurting.  After he takes his pill, his BP comes back to normal.  Yesterday BP went to 137/90, took BP med and was brought to 117/72, this AM 117/74.  Wants to know if MD wants to put him on a lower dose of his medication, something that he can take everyday.  Has been taking Lisinopril 25 mg for many years but then his body started not tolerating it and BP was going to low.  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  Please call something [facility] to [facility].  CALL PT BACK AT [phone] TO LET HIM KNOW THIS HAS BEEN HANDLED.

## 2012-05-24 NOTE — Telephone Encounter (Signed)
Pt informed and med sent in 

## 2012-05-24 NOTE — Telephone Encounter (Signed)
He was on a base drug with a diuretic I would recommend that he just go on Prinivil 10 mg by mouth daily The diuretic his platelets dropping his blood pressures are significantly

## 2012-09-19 ENCOUNTER — Other Ambulatory Visit: Payer: Self-pay | Admitting: *Deleted

## 2012-09-19 MED ORDER — SIMVASTATIN 20 MG PO TABS: 20.0000 mg | ORAL_TABLET | Freq: Every day | ORAL | Status: DC

## 2012-10-11 ENCOUNTER — Telehealth: Payer: Self-pay | Admitting: Internal Medicine

## 2012-10-11 DIAGNOSIS — Z0000 Encounter for general adult medical examination without abnormal findings: Secondary | ICD-10-CM

## 2012-10-11 NOTE — Telephone Encounter (Signed)
Scheduled for labs, but no labs are ordered- pt dx-dm,hyperlipidemia, hypertension and he wants ua

## 2012-10-11 NOTE — Telephone Encounter (Signed)
Pt is sch for labs 11/06/12 and is req to get a ua added to lab order. Pls order the req lab.

## 2012-10-11 NOTE — Telephone Encounter (Signed)
Lipid, lft, bmet, a1c, ua and urine microalbuminuria

## 2012-10-11 NOTE — Telephone Encounter (Signed)
Order placed

## 2012-11-06 ENCOUNTER — Other Ambulatory Visit (INDEPENDENT_AMBULATORY_CARE_PROVIDER_SITE_OTHER): Payer: BC Managed Care – PPO

## 2012-11-06 DIAGNOSIS — IMO0001 Reserved for inherently not codable concepts without codable children: Secondary | ICD-10-CM

## 2012-11-06 DIAGNOSIS — E1165 Type 2 diabetes mellitus with hyperglycemia: Secondary | ICD-10-CM

## 2012-11-06 DIAGNOSIS — Z0000 Encounter for general adult medical examination without abnormal findings: Secondary | ICD-10-CM

## 2012-11-06 DIAGNOSIS — IMO0002 Reserved for concepts with insufficient information to code with codable children: Secondary | ICD-10-CM

## 2012-11-06 LAB — BASIC METABOLIC PANEL
BUN: 18 mg/dL (ref 6–23)
Chloride: 99 mEq/L (ref 96–112)
Glucose, Bld: 140 mg/dL — ABNORMAL HIGH (ref 70–99)
Potassium: 3.8 mEq/L (ref 3.5–5.1)

## 2012-11-06 LAB — HEPATIC FUNCTION PANEL
ALT: 26 U/L (ref 0–53)
Albumin: 4.2 g/dL (ref 3.5–5.2)
Total Bilirubin: 0.9 mg/dL (ref 0.3–1.2)

## 2012-11-06 LAB — MICROALBUMIN / CREATININE URINE RATIO
Microalb Creat Ratio: 0.5 mg/g (ref 0.0–30.0)
Microalb, Ur: 1.4 mg/dL (ref 0.0–1.9)

## 2012-11-06 LAB — LIPID PANEL
HDL: 49 mg/dL (ref >39.00)
Total CHOL/HDL Ratio: 3
VLDL: 15.4 mg/dL (ref 0.0–40.0)

## 2012-11-07 ENCOUNTER — Other Ambulatory Visit: Payer: BC Managed Care – PPO

## 2012-11-13 ENCOUNTER — Encounter: Payer: Self-pay | Admitting: Internal Medicine

## 2012-11-13 ENCOUNTER — Ambulatory Visit (INDEPENDENT_AMBULATORY_CARE_PROVIDER_SITE_OTHER): Payer: BC Managed Care – PPO | Admitting: Internal Medicine

## 2012-11-13 VITALS — BP 134/90 | HR 72 | Temp 98.4°F | Wt 208.0 lb

## 2012-11-13 DIAGNOSIS — Z23 Encounter for immunization: Secondary | ICD-10-CM

## 2012-11-14 NOTE — Progress Notes (Signed)
  Subjective:    Patient ID: Danny Schroeder, male    DOB: 07-04-1971, 41 y.o.   MRN: [id number]  HPI    Review of Systems     Objective:   Physical Exam        Assessment & Plan:  Scanned progress note due to unexpected downtime

## 2013-01-08 ENCOUNTER — Encounter: Payer: Self-pay | Admitting: Family Medicine

## 2013-01-08 ENCOUNTER — Ambulatory Visit (INDEPENDENT_AMBULATORY_CARE_PROVIDER_SITE_OTHER): Payer: BC Managed Care – PPO | Admitting: Family Medicine

## 2013-01-08 ENCOUNTER — Ambulatory Visit: Payer: BC Managed Care – PPO | Admitting: Family

## 2013-01-08 VITALS — BP 142/99 | HR 74 | Temp 98.4°F | Ht 72.0 in | Wt 210.0 lb

## 2013-01-08 DIAGNOSIS — M7918 Myalgia, other site: Secondary | ICD-10-CM

## 2013-01-08 DIAGNOSIS — IMO0001 Reserved for inherently not codable concepts without codable children: Secondary | ICD-10-CM

## 2013-01-08 NOTE — Progress Notes (Signed)
OFFICE NOTE  01/08/2013  CC:  Chief Complaint  Patient presents with  . Shoulder Pain    left x 5-6 weeks; no known injury; using heat, massage and ibuprofen     HPI: Patient is a 42 y.o. middle [place] male who is here for left shoulder pain.   Onset about 5-6 wks ago, not getting better, worse with picking up wt and moving arm in certain directions. Advil, massage, heating pad brings some temporary relief.  Feels like it is in trapezius/suprascapular region.   No radiculopathy sx's.   No preceding trauma or strain.  Neck movements don't affect it.  No hx of similar problem.  Pertinent PMH:  Past Medical History  Diagnosis Date  . Hyperlipidemia   . Hypertension   . Diabetes mellitus    History reviewed. No pertinent past surgical history.  MEDS:  Outpatient Prescriptions Prior to Visit  Medication Sig Dispense Refill  . lisinopril (PRINIVIL,ZESTRIL) 10 MG tablet Take 1 tablet (10 mg total) by mouth daily.  90 tablet  3  . simvastatin (ZOCOR) 20 MG tablet Take 1 tablet (20 mg total) by mouth at bedtime.  90 tablet  1  . metFORMIN (GLUCOPHAGE) 500 MG tablet Take 1,000 mg by mouth 2 (two) times daily with a meal.       No facility-administered medications prior to visit.    PE: Blood pressure 142/99, pulse 74, temperature 98.4 F (36.9 C), temperature source Temporal, height 6' (1.829 m), weight 210 lb (95.255 kg), SpO2 100.00%. Gen: Alert, well appearing.  Patient is oriented to person, place, time, and situation. Neck: nontender, full ROM without stiffness.   He has tenderness diffusely over the left upper trapezius region down over the entire left scapula.  No paraspinous muscle tenderness or spasm, no midline spinal tenderness.  Shoulder ROM intact without shoulder pain, but resisted abd and flex/ext bring out a bit of the pain in the previously described back muscle regions.  No subacromial or AC jt tenderness.  UE strength 5/5 prox/dist bilat.  DTRs 2+ in biceps/triceps  bilat.   Spurling's test negative  IMPRESSION AND PLAN:  Myofascial pain No trigger point is present. Rx'd Dermatran myofascial pain compounded cream: 2 g qid prn. Massage, heat, stretching, relative rest emphasized. If no signif improvement in 7-10d then needs PT referral.   An After Visit Summary was printed and given to the patient.  FOLLOW UP: prn

## 2013-01-11 DIAGNOSIS — M7918 Myalgia, other site: Secondary | ICD-10-CM | POA: Insufficient documentation

## 2013-01-11 NOTE — Assessment & Plan Note (Signed)
No trigger point is present. Rx'd Dermatran myofascial pain compounded cream: 2 g qid prn. Massage, heat, stretching, relative rest emphasized. If no signif improvement in 7-10d then needs PT referral.

## 2013-01-23 ENCOUNTER — Emergency Department
Admission: EM | Admit: 2013-01-23 | Discharge: 2013-01-23 | Disposition: A | Discharge status: Home / Self Care | Payer: BC Managed Care – PPO | Source: Home / Self Care | Attending: Family Medicine | Admitting: Family Medicine

## 2013-01-23 ENCOUNTER — Encounter: Payer: Self-pay | Admitting: *Deleted

## 2013-01-23 ENCOUNTER — Emergency Department (INDEPENDENT_AMBULATORY_CARE_PROVIDER_SITE_OTHER): Payer: BC Managed Care – PPO

## 2013-01-23 DIAGNOSIS — M545 Low back pain, unspecified: Secondary | ICD-10-CM

## 2013-01-23 MED ORDER — MELOXICAM 15 MG PO TABS: 15.0000 mg | ORAL_TABLET | Freq: Every day | ORAL | Status: DC

## 2013-01-23 MED ORDER — HYDROCODONE-ACETAMINOPHEN 5-325 MG PO TABS: ORAL_TABLET | ORAL | Status: DC

## 2013-01-23 MED ORDER — KETOROLAC TROMETHAMINE 60 MG/2ML IM SOLN
60.0000 mg | Freq: Once | INTRAMUSCULAR | Status: AC
  Administered 2013-01-23: 60 mg via INTRAMUSCULAR

## 2013-01-23 NOTE — ED Notes (Signed)
Pt c/o LT side low back pain x last night after bending over.  He took 3 advil last night.

## 2013-01-23 NOTE — ED Provider Notes (Signed)
History     CSN: [id number]  Arrival date & time 01/23/13  4098   None     Chief Complaint  Patient presents with  . Back Pain       HPI Comments: At 8PM yesterday patient bent over forward and suddenly experienced a sharp pain in his left lower back.  The pain has persisted and is worse with movement and cough.  No bowel or bladder dysfunction.  No saddle numbness.  The pain radiates intermittently to his left thigh.  Patient is a 42 y.o. male presenting with back pain. The history is provided by the patient.  Back Pain Location:  Sacro-iliac joint Quality:  Shooting Radiates to:  L thigh Pain severity:  Moderate Pain is:  Same all the time Onset quality:  Sudden Timing:  Constant Progression:  Unchanged Chronicity:  New Context comment:  Bending over Relieved by:  Nothing Worsened by:  Coughing and movement Ineffective treatments:  Heating pad Associated symptoms: no abdominal pain, no bladder incontinence, no bowel incontinence, no dysuria, no fever, no leg pain, no numbness, no paresthesias, no perianal numbness, no tingling, no weakness and no weight loss     Past Medical History  Diagnosis Date  . Hyperlipidemia   . Hypertension   . Diabetes mellitus     Past Surgical History  Procedure Laterality Date  . Knee surgery      Family History  Problem Relation Age of Onset  . Heart disease Mother   . Diabetes Mother   . Hypertension Mother   . Heart disease Father 84    CABG  . Diabetes Sister   . Hypertension Sister     History  Substance Use Topics  . Smoking status: Former Games developer  . Smokeless tobacco: Never Used  . Alcohol Use: No      Review of Systems  Constitutional: Negative for fever and weight loss.  Gastrointestinal: Negative for abdominal pain and bowel incontinence.  Genitourinary: Negative for bladder incontinence and dysuria.  Musculoskeletal: Positive for back pain.  Neurological: Negative for tingling, weakness, numbness and  paresthesias.  All other systems reviewed and are negative.    Allergies  Review of patient's allergies indicates no known allergies.  Home Medications   Current Outpatient Rx  Name  Route  Sig  Dispense  Refill  . famotidine (PEPCID AC) 10 MG chewable tablet   Oral   Chew 10 mg by mouth 2 (two) times daily.         Marland Kitchen HYDROcodone-acetaminophen (NORCO/VICODIN) 5-325 MG per tablet      Take one by mouth at bedtime as needed for pain   10 tablet   0   . lisinopril (PRINIVIL,ZESTRIL) 10 MG tablet   Oral   Take 1 tablet (10 mg total) by mouth daily.   90 tablet   3   . meloxicam (MOBIC) 15 MG tablet   Oral   Take 1 tablet (15 mg total) by mouth daily. Take with food each morning   15 tablet   0   . metFORMIN (GLUCOPHAGE) 1000 MG tablet   Oral   Take 1,000 mg by mouth 2 (two) times daily with a meal.         . simvastatin (ZOCOR) 20 MG tablet   Oral   Take 1 tablet (20 mg total) by mouth at bedtime.   90 tablet   1     BP 143/90  Pulse 76  Temp(Src) 97.7 F (36.5 C) (Oral)  Ht  6' (1.829 m)  Wt 207 lb (93.895 kg)  BMI 28.07 kg/m2  SpO2 100%  Physical Exam Nursing notes and Vital Signs reviewed. Appearance:  Patient appears healthy, stated age, and in no acute distress Eyes:  Pupils are equal, round, and reactive to light and accomodation.  Extraocular movement is intact.  Conjunctivae are not inflamed  Pharynx:  Normal Neck:  Supple.  No adenopathy Lungs:  Clear to auscultation.  Breath sounds are equal.  Heart:  Regular rate and rhythm without murmurs, rubs, or gallops.  Abdomen:  Nontender without masses or hepatosplenomegaly.  Bowel sounds are present.  No CVA or flank tenderness.  Extremities:  No edema.  No calf tenderness Skin:  No rash present.  Back:   Can heel/toe walk and squat without difficulty.  Decreased forward flexion.  No distinct tenderness to palpation althouth patient points to his left sacral area.   Straight leg raising test is  negative.  Sitting knee extension test is negative.  Strength and sensation in the lower extremities is normal.  Patellar and achilles reflexes are normal   ED Course  Procedures  none   Dg Lumbar Spine 2-3 Views  01/23/2013  *RADIOLOGY REPORT*  Clinical Data:  Acute onset low back pain with radicular symptoms  LUMBAR SPINE - 2-3 VIEW  Comparison: None.  Findings:  Frontal, lateral, and spot lumbosacral lateral images were obtained.  There are five non-rib bearing lumbar type vertebral bodies.  There is no fracture or spondylolisthesis.  Disc spaces appear intact.  No erosive change.  IMPRESSION: No abnormality noted.   Original Report Authenticated By: Bretta Bang, M.D.      1. Acute low back pain       MDM  Toradol 60mg  IM Begin Mobic.  Lortab for night pain. Begin applying ice pack to left lower back for 15 to 20 minutes, 3 to 4 times daily.  Begin back exercises as tolerated ([facility] information and instruction handout given)  Followup with [facility] if not improving about two weeks.         Lattie Haw, MD 01/24/13 2032

## 2013-02-07 ENCOUNTER — Telehealth: Payer: Self-pay | Admitting: *Deleted

## 2013-02-07 DIAGNOSIS — K219 Gastro-esophageal reflux disease without esophagitis: Secondary | ICD-10-CM

## 2013-02-07 NOTE — Telephone Encounter (Signed)
Refer to gi for persistent GERD Ok to take otc prilosec 20 mg po qd until sees gi

## 2013-02-07 NOTE — Telephone Encounter (Signed)
Pt was taking heartburn medicine that you gave him a rx for.  Dr Cato Mulligan told him to take for 30 days and if it came back then we need to do something else.  Pt actually took it for 60 days and stopped.  The heartburn was not there when he was on med but now its back.  I don't see anything on med list that would require prescribing.  Can you advise what you would like for him to do now?

## 2013-02-08 NOTE — Telephone Encounter (Signed)
Pt aware, referral order placed 

## 2013-02-11 ENCOUNTER — Other Ambulatory Visit: Payer: Self-pay | Admitting: Internal Medicine

## 2013-02-14 ENCOUNTER — Encounter: Payer: Self-pay | Admitting: Internal Medicine

## 2013-03-01 ENCOUNTER — Encounter: Payer: Self-pay | Admitting: Internal Medicine

## 2013-03-06 ENCOUNTER — Encounter: Payer: Self-pay | Admitting: Internal Medicine

## 2013-03-06 ENCOUNTER — Ambulatory Visit (INDEPENDENT_AMBULATORY_CARE_PROVIDER_SITE_OTHER): Payer: BC Managed Care – PPO | Admitting: Internal Medicine

## 2013-03-06 VITALS — BP 132/90 | HR 82 | Ht 72.0 in | Wt 212.0 lb

## 2013-03-06 DIAGNOSIS — K219 Gastro-esophageal reflux disease without esophagitis: Secondary | ICD-10-CM

## 2013-03-06 MED ORDER — FAMOTIDINE 10 MG PO CHEW: 20.0000 mg | CHEWABLE_TABLET | Freq: Every evening | ORAL | Status: DC | PRN

## 2013-03-06 NOTE — Patient Instructions (Addendum)
We have sent the following medications to your pharmacy for you to pick up at your convenience: pepcid; please take as directed         Diet for Gastroesophageal Reflux Disease, Adult Reflux (acid reflux) is when acid from your stomach flows up into the esophagus. When acid comes in contact with the esophagus, the acid causes irritation and soreness (inflammation) in the esophagus. When reflux happens often or so severely that it causes damage to the esophagus, it is called gastroesophageal reflux disease (GERD). Nutrition therapy can help ease the discomfort of GERD. FOODS OR DRINKS TO AVOID OR LIMIT  Smoking or chewing tobacco. Nicotine is one of the most potent stimulants to acid production in the gastrointestinal tract.  Caffeinated and decaffeinated coffee and black tea.  Regular or low-calorie carbonated beverages or energy drinks (caffeine-free carbonated beverages are allowed).   Strong spices, such as black pepper, white pepper, red pepper, cayenne, curry powder, and chili powder.  Peppermint or spearmint.  Chocolate.  High-fat foods, including meats and fried foods. Extra added fats including oils, butter, salad dressings, and nuts. Limit these to less than 8 tsp per day.  Fruits and vegetables if they are not tolerated, such as citrus fruits or tomatoes.  Alcohol.  Any food that seems to aggravate your condition. If you have questions regarding your diet, call your caregiver or a registered dietitian. OTHER THINGS THAT MAY HELP GERD INCLUDE:   Eating your meals slowly, in a relaxed setting.  Eating 5 to 6 small meals per day instead of 3 large meals.  Eliminating food for a period of time if it causes distress.  Not lying down until 3 hours after eating a meal.  Keeping the head of your bed raised 6 to 9 inches (15 to 23 cm) by using a foam wedge or blocks under the legs of the bed. Lying flat may make symptoms worse.  Being physically active. Weight loss may  be helpful in reducing reflux in overweight or obese adults.  Wear loose fitting clothing EXAMPLE MEAL PLAN This meal plan is approximately 2,000 calories based on https://www.[facility].org/ meal planning guidelines. Breakfast   cup cooked oatmeal.  1 cup strawberries.  1 cup low-fat milk.  1 oz almonds. Snack  1 cup cucumber slices.  6 oz yogurt (made from low-fat or fat-free milk). Lunch  2 slice whole-wheat bread.  2 oz sliced [place].  2 tsp mayonnaise.  1 cup blueberries.  1 cup snap peas. Snack  6 whole-wheat crackers.  1 oz string cheese. Dinner   cup brown rice.  1 cup mixed veggies.  1 tsp olive oil.  3 oz grilled fish. Document Released: 11/15/2005 Document Revised: 02/07/2012 Document Reviewed: 10/01/2011 [facility] Patient Information 2013 [place], [place].                                               We are excited to introduce MyChart, a new best-in-class service that provides you online access to important information in your electronic medical record. We want to make it easier for you to view your health information - all in one secure location - when and where you need it. We expect MyChart will enhance the quality of care and service we provide.  When you register for MyChart, you can:    View your test results.    Request appointments and  receive appointment reminders via email.    Request medication renewals.    View your medical history, allergies, medications and immunizations.    Communicate with your physician's office through a password-protected site.    Conveniently print information such as your medication lists.  To find out if MyChart is right for you, please talk to a member of our clinical staff today. We will gladly answer your questions about this free health and wellness tool.  If you are age 59 or older and want a member of your family to have access to your record, you must provide written consent by completing a proxy  form available at our office. Please speak to our clinical staff about guidelines regarding accounts for patients younger than age 2.  As you activate your MyChart account and need any technical assistance, please call the MyChart technical support line at [phone]) or email your question to [email or web address]. If you email your question(s), please include your name, a return phone number and the best time to reach you.  If you have non-urgent health-related questions, you can send a message to our office through MyChart at [email or web address]. If you have a medical emergency, call 911.  Thank you for using MyChart as your new health and wellness resource!   MyChart licensed from [employer],  2956-2130. Patents Pending.

## 2013-03-06 NOTE — Progress Notes (Signed)
Patient ID: Danny Schroeder, male   DOB: [birth year], 42 y.o.   MRN: [id number] HPI: Danny Schroeder is 42 yo male with PMH of hypertension, hyperlipidemia, diabetes, and GERD who seen in consultation request of Dr. Cato Mulligan for evaluation of acid reflux.  The patient is alone today.  The patient reports on and off heartburn over last several months. This is described as substernal burning sensation worse at night. He has no dysphagia or done dysphagia. No nausea or vomiting. No abdominal pain. He had used condoms in the past but this did not provide significant relief. He was started on a trial famotidine 20 mg at bedtime by Dr. Cato Mulligan.  This completely relieves all of his heartburn symptoms. He initially took the medication for 30 days, but when he discontinued it his symptoms return. He wonders if this medication can be used on an ongoing basis as he benefits greatly from it. He denies change in bowel habits. No blood in his stool or melena. He denies diarrhea or constipation. No fevers or chills. Good appetite and energy levels.  Past Medical History  Diagnosis Date  . Hyperlipidemia   . Hypertension   . Diabetes mellitus   . GERD (gastroesophageal reflux disease)     Past Surgical History  Procedure Laterality Date  . Knee surgery  2005    right    Current Outpatient Prescriptions  Medication Sig Dispense Refill  . famotidine (PEPCID AC) 10 MG chewable tablet Chew 2 tablets (20 mg total) by mouth at bedtime as needed for heartburn.  90 tablet  3  . lisinopril (PRINIVIL,ZESTRIL) 10 MG tablet       . metFORMIN (GLUCOPHAGE) 1000 MG tablet Take 1,000 mg by mouth 2 (two) times daily with a meal.      . simvastatin (ZOCOR) 20 MG tablet Take 1 tablet (20 mg total) by mouth at bedtime.  90 tablet  1   No current facility-administered medications for this visit.    No Known Allergies  Family History  Problem Relation Age of Onset  . Heart disease Mother   . Diabetes Mother   . Hypertension Mother   .  Heart disease Father 59    CABG  . Diabetes Sister   . Hypertension Sister     History  Substance Use Topics  . Smoking status: Former Smoker    Quit date: 03/06/1993  . Smokeless tobacco: Never Used  . Alcohol Use: Yes     Comment: rarely    ROS: As per history of present illness, otherwise negative  BP 132/90  Pulse 82  Ht 6' (1.829 m)  Wt 212 lb (96.163 kg)  BMI 28.75 kg/m2 Constitutional: Well-developed and well-nourished. No distress. HEENT: Normocephalic and atraumatic. Oropharynx is clear and moist. No oropharyngeal exudate. Conjunctivae are normal.  No scleral icterus. Neck: Neck supple. Trachea midline. Cardiovascular: Normal rate, regular rhythm and intact distal pulses. No M/R/G Pulmonary/chest: Effort normal and breath sounds normal. No wheezing, rales or rhonchi. Abdominal: Soft, nontender, nondistended. Bowel sounds active throughout. There are no masses palpable. No hepatosplenomegaly. Extremities: no clubbing, cyanosis, or edema Lymphadenopathy: No cervical adenopathy noted. Neurological: Alert and oriented to person place and time. Skin: Skin is warm and dry. No rashes noted. Psychiatric: Normal mood and affect. Behavior is normal.  RELEVANT LABS AND IMAGING:  CMP     Component Value Date/Time   NA 134* 11/06/2012 1025   K 3.8 11/06/2012 1025   CL 99 11/06/2012 1025   CO2 27 11/06/2012 1025  GLUCOSE 140* 11/06/2012 1025   BUN 18 11/06/2012 1025   CREATININE 1.0 11/06/2012 1025   CALCIUM 9.2 11/06/2012 1025   PROT 7.6 11/06/2012 1025   ALBUMIN 4.2 11/06/2012 1025   AST 24 11/06/2012 1025   ALT 26 11/06/2012 1025   ALKPHOS 69 11/06/2012 1025   BILITOT 0.9 11/06/2012 1025   GFRNONAA 100.95 09/01/2010 1119   GFRAA 123 06/29/2007 1118    ASSESSMENT/PLAN: 42 yo male with PMH of hypertension, hyperlipidemia, diabetes, and GERD who seen in consultation request of Dr. Cato Mulligan for evaluation of acid reflux.  1.  GERD -- mild GERD symptoms completely relieved with  once daily famotidine. There are no alarm symptoms and therefore upper endoscopy is not felt indicated at this time. We discussed GERD hygiene including avoiding lying down within 90 minutes of eating. We also discussed other trigger foods such as caffeine, alcohol, chocolate, peppermint, and other acidic foods such as citrus juices.  Given his return of symptoms upon discontinuation of H2 blocker, he will continue this medication 20 mg each bedtime for now. We discussed safety, and he can use this long-term if necessary. Should he develop alarm symptoms, upper endoscopy would be indicated. He is asked to call me if his symptoms change or progress in any way. He voices understanding. He is given a GERD diet handout today. He can return as needed

## 2013-03-08 ENCOUNTER — Telehealth: Payer: Self-pay | Admitting: Internal Medicine

## 2013-03-08 DIAGNOSIS — K219 Gastro-esophageal reflux disease without esophagitis: Secondary | ICD-10-CM

## 2013-03-08 MED ORDER — OMEPRAZOLE 20 MG PO CPDR: 20.0000 mg | DELAYED_RELEASE_CAPSULE | Freq: Every day | ORAL | Status: DC

## 2013-03-08 MED ORDER — FAMOTIDINE 20 MG PO TABS: 20.0000 mg | ORAL_TABLET | Freq: Two times a day (BID) | ORAL | Status: DC

## 2013-03-08 NOTE — Telephone Encounter (Signed)
Pt. Was on Omeprazole and would like to stay on it. I discontinues the Pepcid and send in an Rx for Omeprazole. Pt was happy with that.

## 2013-03-08 NOTE — Telephone Encounter (Signed)
Sent in 20mg  Rx to [facility]. Discontinued original Rx

## 2013-03-08 NOTE — Telephone Encounter (Signed)
Rx for Pepcid sent to pharmacy.

## 2013-03-13 ENCOUNTER — Telehealth: Payer: Self-pay | Admitting: Internal Medicine

## 2013-03-13 NOTE — Telephone Encounter (Signed)
Patient Information:  Caller Name: Hildred  Phone: [phone]  Patient: Danny Schroeder,   Gender: Male  DOB: [birth year]  Age: 42 Years  PCP: Birdie Sons (Adults only)  Office Follow Up:  Does the office need to follow up with this patient?: No  Instructions For The Office: Requested MD only.     Symptoms  Reason For Call & Symptoms: BP out of range; has been 130's/90's for estimated 1 month.  Reports he checks it at home twice weekly.  Increased Lisinopril to 30 mg without response.  See Within 2 Weeks in  Office per High Blood Pressure guideline.  Caller agreed, but declined to schedule on 4/15 due to work.  Reviewed Health History In EMR: Yes  Reviewed Medications In EMR: Yes  Reviewed Allergies In EMR: Yes  Reviewed Surgeries / Procedures: Yes  Date of Onset of Symptoms: 02/10/2013  Treatments Tried: Increased Lisinopril  Treatments Tried Worked: No  Guideline(s) Used:  High Blood Pressure  Disposition Per Guideline:   See Within 2 Weeks in Office  Reason For Disposition Reached:   BP > 130/80 and history of heart problems, kidney disease, or diabetes  Advice Given:  General:  Untreated high blood pressure may cause damage to the heart, brain, kidneys, and eyes.  Treatment of high blood pressure can reduce the risk of stroke, heart attack, and heart failure.  Call Back If:  Headache, blurred vision, difficulty talking, or difficulty walking occurs  Chest pain or difficulty breathing occurs  You want to go in to the office for a blood pressure check  You become worse.  Patient Will Follow Care Advice:  YES  Appointment Scheduled:  03/14/2013 10:15:00 Appointment Scheduled Provider:  Berniece Andreas [facility])

## 2013-03-14 ENCOUNTER — Ambulatory Visit: Payer: Self-pay | Admitting: Internal Medicine

## 2013-03-15 ENCOUNTER — Ambulatory Visit (INDEPENDENT_AMBULATORY_CARE_PROVIDER_SITE_OTHER): Payer: BC Managed Care – PPO | Admitting: Sports Medicine

## 2013-03-15 ENCOUNTER — Encounter: Payer: Self-pay | Admitting: Sports Medicine

## 2013-03-15 VITALS — BP 146/90 | HR 99 | Temp 98.7°F | Wt 213.0 lb

## 2013-03-15 DIAGNOSIS — I10 Essential (primary) hypertension: Secondary | ICD-10-CM

## 2013-03-15 DIAGNOSIS — E119 Type 2 diabetes mellitus without complications: Secondary | ICD-10-CM

## 2013-03-15 DIAGNOSIS — E785 Hyperlipidemia, unspecified: Secondary | ICD-10-CM

## 2013-03-15 DIAGNOSIS — K219 Gastro-esophageal reflux disease without esophagitis: Secondary | ICD-10-CM

## 2013-03-15 MED ORDER — ASPIRIN EC 81 MG PO TBEC: 81.0000 mg | DELAYED_RELEASE_TABLET | Freq: Every day | ORAL | Status: AC

## 2013-03-15 MED ORDER — LISINOPRIL-HYDROCHLOROTHIAZIDE 20-12.5 MG PO TABS: 1.0000 | ORAL_TABLET | Freq: Every day | ORAL | Status: DC

## 2013-03-15 NOTE — Assessment & Plan Note (Signed)
Changing to lisinopril/hydrochlorothiazide 20/12.5 mg.

## 2013-03-15 NOTE — Assessment & Plan Note (Signed)
Excellent control with simvastatin 20. No changes.

## 2013-03-15 NOTE — Progress Notes (Addendum)
  Subjective:    CC: Establish care.   HPI:  Hypertension: Has been lisinopril 30 mg daily, this is been for several years, no headaches, visual changes, lower extremity swelling, control has been inadequate.  Diabetes mellitus type 2: Has had fairly good control with metformin 1000 mg twice a day. Gets yearly eye exams, and yearly foot exams.  Hyperlipidemia: Well controlled with Zocor 20.  GERD: Well controlled with omeprazole.  Past medical history, Surgical history, Family history not pertinant except as noted below, Social history, Allergies, and medications have been entered into the medical record, reviewed, and no changes needed.   Review of Systems: No headache, visual changes, nausea, vomiting, diarrhea, constipation, dizziness, abdominal pain, skin rash, fevers, chills, night sweats, swollen lymph nodes, weight loss, chest pain, body aches, joint swelling, muscle aches, shortness of breath, mood changes, visual or auditory hallucinations.  Objective:    General: Well Developed, well nourished, and in no acute distress.  Neuro: Alert and oriented x3, extra-ocular muscles intact, sensation grossly intact.  HEENT: Normocephalic, atraumatic, pupils equal round reactive to light, neck supple, no masses, no lymphadenopathy, thyroid nonpalpable.  Skin: Warm and dry, no rashes noted.  Cardiac: Regular rate and rhythm, no murmurs rubs or gallops.  Respiratory: Clear to auscultation bilaterally. Not using accessory muscles, speaking in full sentences.  Abdominal: Soft, nontender, nondistended, positive bowel sounds, no masses, no organomegaly.  Musculoskeletal: Shoulder, elbow, wrist, hip, knee, ankle stable, and with full range of motion. Monofilament exam is normal. Impression and Recommendations:    The patient was counselled, risk factors were discussed, anticipatory guidance given.

## 2013-03-15 NOTE — Assessment & Plan Note (Signed)
Continue Prilosec

## 2013-03-15 NOTE — Assessment & Plan Note (Signed)
Beautifully controlled. Continue metformin, adding baby aspirin. Continue lisinopril, no need to check microalbumin. Foot exam done today, normal. Does get regular yearly ophthalmologic exams.

## 2013-04-01 ENCOUNTER — Other Ambulatory Visit: Payer: Self-pay | Admitting: Internal Medicine

## 2013-04-02 ENCOUNTER — Other Ambulatory Visit: Payer: Self-pay | Admitting: Sports Medicine

## 2013-04-02 MED ORDER — SIMVASTATIN 20 MG PO TABS: 20.0000 mg | ORAL_TABLET | Freq: Every day | ORAL | Status: DC

## 2013-04-05 ENCOUNTER — Ambulatory Visit: Payer: BC Managed Care – PPO | Admitting: Sports Medicine

## 2013-04-09 ENCOUNTER — Telehealth: Payer: Self-pay | Admitting: *Deleted

## 2013-04-09 DIAGNOSIS — E119 Type 2 diabetes mellitus without complications: Secondary | ICD-10-CM

## 2013-04-09 NOTE — Telephone Encounter (Signed)
A1c, CMET, CBC, lipid panel, TSH, Vit D. I will add orders.

## 2013-04-09 NOTE — Telephone Encounter (Signed)
Pt had abnormal A1C and BMP. What labs would you like drawn and I can fax them to lab for the pt.

## 2013-04-09 NOTE — Telephone Encounter (Signed)
Patient calls and states you told him to call you a few days before he needed to get labs which is the 16th and you would send order. Can you look at this I don't see anything in there.

## 2013-04-09 NOTE — Telephone Encounter (Signed)
Pt informed and lab orders faxed.

## 2013-04-12 ENCOUNTER — Ambulatory Visit: Payer: BC Managed Care – PPO | Admitting: Sports Medicine

## 2013-04-12 LAB — COMPREHENSIVE METABOLIC PANEL WITH GFR
Albumin: 4.4 g/dL (ref 3.5–5.2)
BUN: 18 mg/dL (ref 6–23)
CO2: 28 meq/L (ref 19–32)
Calcium: 9.4 mg/dL (ref 8.4–10.5)
Glucose, Bld: 131 mg/dL — ABNORMAL HIGH (ref 70–99)
Potassium: 4.3 meq/L (ref 3.5–5.3)
Sodium: 138 meq/L (ref 135–145)
Total Protein: 7 g/dL (ref 6.0–8.3)

## 2013-04-12 LAB — LIPID PANEL
Cholesterol: 143 mg/dL (ref 0–200)
HDL: 42 mg/dL (ref >39)
LDL Cholesterol: 83 mg/dL (ref 0–99)
Total CHOL/HDL Ratio: 3.4 Ratio
Triglycerides: 89 mg/dL (ref <150)
VLDL: 18 mg/dL (ref 0–40)

## 2013-04-12 LAB — CBC
HCT: 39.6 % (ref 39.0–52.0)
Hemoglobin: 13.6 g/dL (ref 13.0–17.0)
MCH: 27 pg (ref 26.0–34.0)
MCHC: 34.3 g/dL (ref 30.0–36.0)
MCV: 78.6 fL (ref 78.0–100.0)
Platelets: 231 10*3/uL (ref 150–400)
RBC: 5.04 MIL/uL (ref 4.22–5.81)
RDW: 15 % (ref 11.5–15.5)
WBC: 7.2 K/uL (ref 4.0–10.5)

## 2013-04-12 LAB — COMPREHENSIVE METABOLIC PANEL
ALT: 35 U/L (ref 0–53)
AST: 25 U/L (ref 0–37)
Alkaline Phosphatase: 70 U/L (ref 39–117)
Chloride: 100 mEq/L (ref 96–112)
Creat: 1.01 mg/dL (ref 0.50–1.35)
Total Bilirubin: 0.7 mg/dL (ref 0.3–1.2)

## 2013-04-12 LAB — HEMOGLOBIN A1C
Hgb A1c MFr Bld: 6.5 % — ABNORMAL HIGH (ref <5.7)
Mean Plasma Glucose: 140 mg/dL — ABNORMAL HIGH (ref <117)

## 2013-04-12 LAB — TSH: TSH: 0.88 u[IU]/mL (ref 0.350–4.500)

## 2013-04-13 LAB — VITAMIN D 25 HYDROXY (VIT D DEFICIENCY, FRACTURES): Vit D, 25-Hydroxy: 24 ng/mL — ABNORMAL LOW (ref 30–89)

## 2013-04-13 MED ORDER — VITAMIN D (ERGOCALCIFEROL) 1.25 MG (50000 UNIT) PO CAPS: 50000.0000 [IU] | ORAL_CAPSULE | ORAL | Status: DC

## 2013-04-13 NOTE — Addendum Note (Signed)
Addended by: Monica Becton on: 04/13/2013 08:49 AM   Modules accepted: Orders

## 2013-04-24 ENCOUNTER — Other Ambulatory Visit: Payer: Self-pay | Admitting: Sports Medicine

## 2013-04-24 ENCOUNTER — Encounter: Payer: Self-pay | Admitting: Sports Medicine

## 2013-04-24 ENCOUNTER — Ambulatory Visit (INDEPENDENT_AMBULATORY_CARE_PROVIDER_SITE_OTHER): Payer: BC Managed Care – PPO | Admitting: Sports Medicine

## 2013-04-24 ENCOUNTER — Ambulatory Visit (INDEPENDENT_AMBULATORY_CARE_PROVIDER_SITE_OTHER): Payer: BC Managed Care – PPO

## 2013-04-24 VITALS — BP 139/93 | HR 84 | Ht 72.0 in | Wt 213.0 lb

## 2013-04-24 DIAGNOSIS — M25561 Pain in right knee: Secondary | ICD-10-CM

## 2013-04-24 DIAGNOSIS — R52 Pain, unspecified: Secondary | ICD-10-CM

## 2013-04-24 DIAGNOSIS — E119 Type 2 diabetes mellitus without complications: Secondary | ICD-10-CM

## 2013-04-24 DIAGNOSIS — M25569 Pain in unspecified knee: Secondary | ICD-10-CM

## 2013-04-24 DIAGNOSIS — I10 Essential (primary) hypertension: Secondary | ICD-10-CM

## 2013-04-24 DIAGNOSIS — E785 Hyperlipidemia, unspecified: Secondary | ICD-10-CM

## 2013-04-24 MED ORDER — LISINOPRIL-HYDROCHLOROTHIAZIDE 20-25 MG PO TABS: 1.0000 | ORAL_TABLET | Freq: Every day | ORAL | Status: DC

## 2013-04-24 MED ORDER — MELOXICAM 15 MG PO TABS: ORAL_TABLET | ORAL | Status: DC

## 2013-04-24 NOTE — Assessment & Plan Note (Signed)
Switching lisinopril/hydrochlorothiazide 20/25 mg. Return to recheck in 2 weeks.

## 2013-04-24 NOTE — Progress Notes (Signed)
  Subjective:    CC: Followup  HPI: Diabetes mellitus type 2: Extremely well controlled, doing well with metformin.  Hypertension: Improved with blood pressure medication, but not perfect. No chest pain, headaches, visual changes.  Hyperlipidemia: Well controlled.  Medial knee pain, right:  Status post partial medial meniscectomy, now with recurrent pain with a mechanical symptoms at the medial joint line, and just below it at the pes anserine insertion. Pain is localized, doesn't radiate, mild to moderate. Worse with ambulation.  Past medical history, Surgical history, Family history not pertinant except as noted below, Social history, Allergies, and medications have been entered into the medical record, reviewed, and no changes needed.   Review of Systems: No fevers, chills, night sweats, weight loss, chest pain, or shortness of breath.   Objective:    General: Well Developed, well nourished, and in no acute distress.  Neuro: Alert and oriented x3, extra-ocular muscles intact, sensation grossly intact.  HEENT: Normocephalic, atraumatic, pupils equal round reactive to light, neck supple, no masses, no lymphadenopathy, thyroid nonpalpable.  Skin: Warm and dry, no rashes. Cardiac: Regular rate and rhythm, no murmurs rubs or gallops, no lower extremity edema.  Respiratory: Clear to auscultation bilaterally. Not using accessory muscles, speaking in full sentences. Right Knee: Normal to inspection with no erythema or effusion or obvious bony abnormalities. Palpation normal with no warmth, joint line tenderness, patellar tenderness, or condyle tenderness. ROM full in flexion and extension and lower leg rotation. Ligaments with solid consistent endpoints including ACL, PCL, LCL, MCL. Negative Mcmurray's, Apley's, and Thessalonian tests. Non painful patellar compression. Patellar glide without crepitus. Patellar and quadriceps tendons unremarkable. Hamstring and quadriceps strength is  normal.  Tender to palpation at the pes anserine insertion. No medial joint line pain.  X-rays are reviewed and are negative for fracture, dislocation, or degenerative change.  Impression and Recommendations:

## 2013-04-24 NOTE — Assessment & Plan Note (Signed)
Controlled, no changes. 

## 2013-04-24 NOTE — Assessment & Plan Note (Addendum)
Post partial medial meniscectomy. Pain is located just distal to the joint line over the medial proximal tibia. Likely has some mild degenerative joint disease. X-rays, Mobic, continue knee brace that he already has, pes anserine exercises. We can consider pes anserine injection if no better at return visit.

## 2013-05-09 ENCOUNTER — Ambulatory Visit (INDEPENDENT_AMBULATORY_CARE_PROVIDER_SITE_OTHER): Payer: BC Managed Care – PPO | Admitting: Sports Medicine

## 2013-05-09 ENCOUNTER — Encounter: Payer: Self-pay | Admitting: Sports Medicine

## 2013-05-09 VITALS — BP 115/79 | HR 95

## 2013-05-09 DIAGNOSIS — H6120 Impacted cerumen, unspecified ear: Secondary | ICD-10-CM | POA: Insufficient documentation

## 2013-05-09 DIAGNOSIS — R110 Nausea: Secondary | ICD-10-CM

## 2013-05-09 DIAGNOSIS — M25561 Pain in right knee: Secondary | ICD-10-CM

## 2013-05-09 DIAGNOSIS — E119 Type 2 diabetes mellitus without complications: Secondary | ICD-10-CM

## 2013-05-09 DIAGNOSIS — I10 Essential (primary) hypertension: Secondary | ICD-10-CM

## 2013-05-09 DIAGNOSIS — E785 Hyperlipidemia, unspecified: Secondary | ICD-10-CM

## 2013-05-09 DIAGNOSIS — R638 Other symptoms and signs concerning food and fluid intake: Secondary | ICD-10-CM

## 2013-05-09 DIAGNOSIS — IMO0002 Reserved for concepts with insufficient information to code with codable children: Secondary | ICD-10-CM | POA: Insufficient documentation

## 2013-05-09 DIAGNOSIS — H6122 Impacted cerumen, left ear: Secondary | ICD-10-CM

## 2013-05-09 MED ORDER — PROMETHAZINE HCL 25 MG/ML IJ SOLN
25.0000 mg | Freq: Once | INTRAMUSCULAR | Status: AC
  Administered 2013-05-09: 25 mg via INTRAMUSCULAR

## 2013-05-09 MED ORDER — PHENTERMINE HCL 37.5 MG PO CAPS: 37.5000 mg | ORAL_CAPSULE | ORAL | Status: DC

## 2013-05-09 NOTE — Progress Notes (Signed)
  Subjective:    CC: Followup  HPI: Hypertension: But the controlled, no changes.  Diabetes mellitus type 2: Well controlled, no changes  Hyperlipidemia: Well controlled on simvastatin, no changes.  Cerumen impaction:   Worse in the left ear, he does have some decrease in hearing due to this.   Desire to lose weight: Was initially overweight, continuing to try and lose weight to take the pressure off of his knees, and improved diabetic control. Wondering if we can try weight loss medication. He already eats fairly healthy, and exercises far in excess of the routine exercise prescription.  Past medical history, Surgical history, Family history not pertinant except as noted below, Social history, Allergies, and medications have been entered into the medical record, reviewed, and no changes needed.   Review of Systems: No fevers, chills, night sweats, weight loss, chest pain, or shortness of breath.   Objective:    General: Well Developed, well nourished, and in no acute distress.  Neuro: Alert and oriented x3, extra-ocular muscles intact, sensation grossly intact.  HEENT: Normocephalic, atraumatic, pupils equal round reactive to light, neck supple, no masses, no lymphadenopathy, thyroid nonpalpable. left ear canal is clogged with cerumen, I used a curette to remove some of this, we then used irrigation to remove the rest.  Skin: Warm and dry, no rashes. Cardiac: Regular rate and rhythm, no murmurs rubs or gallops, no lower extremity edema.  Respiratory: Clear to auscultation bilaterally. Not using accessory muscles, speaking in full sentences. Impression and Recommendations:

## 2013-05-09 NOTE — Assessment & Plan Note (Signed)
We will do a trial of phentermine. He'll come back monthly for weight checks and refills.

## 2013-05-09 NOTE — Assessment & Plan Note (Signed)
Stable on metformin, no changes.

## 2013-05-09 NOTE — Assessment & Plan Note (Signed)
Stable, no changes. On simvastatin.

## 2013-05-09 NOTE — Assessment & Plan Note (Signed)
Extremely well controlled on lisinopril/hydrochlorothiazide. No changes.

## 2013-05-09 NOTE — Assessment & Plan Note (Addendum)
Removed with irrigation and curettage. Curettage was performed by me.  He did have some vertigo and nausea with irrigation, he was given Phenergan 25 mg intramuscular for this.

## 2013-05-09 NOTE — Assessment & Plan Note (Signed)
This likely represented pes anserine bursitis. Resolved with conservative measures.

## 2013-05-28 ENCOUNTER — Other Ambulatory Visit: Payer: Self-pay | Admitting: Internal Medicine

## 2013-06-03 ENCOUNTER — Other Ambulatory Visit: Payer: Self-pay | Admitting: Internal Medicine

## 2013-12-05 ENCOUNTER — Encounter: Payer: BC Managed Care – PPO | Admitting: Sports Medicine

## 2013-12-10 ENCOUNTER — Encounter: Payer: Self-pay | Admitting: Sports Medicine

## 2013-12-10 ENCOUNTER — Ambulatory Visit (INDEPENDENT_AMBULATORY_CARE_PROVIDER_SITE_OTHER): Payer: BC Managed Care – PPO | Admitting: Sports Medicine

## 2013-12-10 VITALS — BP 123/84 | HR 79 | Wt 209.0 lb

## 2013-12-10 DIAGNOSIS — E119 Type 2 diabetes mellitus without complications: Secondary | ICD-10-CM

## 2013-12-10 DIAGNOSIS — E669 Obesity, unspecified: Secondary | ICD-10-CM

## 2013-12-10 DIAGNOSIS — E785 Hyperlipidemia, unspecified: Secondary | ICD-10-CM

## 2013-12-10 DIAGNOSIS — I10 Essential (primary) hypertension: Secondary | ICD-10-CM

## 2013-12-10 DIAGNOSIS — IMO0002 Reserved for concepts with insufficient information to code with codable children: Secondary | ICD-10-CM

## 2013-12-10 LAB — HEMOGLOBIN A1C
Hgb A1c MFr Bld: 7.2 % — ABNORMAL HIGH (ref <5.7)
Mean Plasma Glucose: 160 mg/dL — ABNORMAL HIGH (ref <117)

## 2013-12-10 LAB — CBC
HCT: 40.8 % (ref 39.0–52.0)
Hemoglobin: 13.9 g/dL (ref 13.0–17.0)
MCH: 27.4 pg (ref 26.0–34.0)
MCHC: 34.1 g/dL (ref 30.0–36.0)
MCV: 80.3 fL (ref 78.0–100.0)
Platelets: 276 10*3/uL (ref 150–400)
RBC: 5.08 MIL/uL (ref 4.22–5.81)
RDW: 14.5 % (ref 11.5–15.5)
WBC: 8.9 K/uL (ref 4.0–10.5)

## 2013-12-10 LAB — COMPREHENSIVE METABOLIC PANEL
AST: 27 U/L (ref 0–37)
Albumin: 4.5 g/dL (ref 3.5–5.2)
Alkaline Phosphatase: 66 U/L (ref 39–117)
BUN: 15 mg/dL (ref 6–23)
Potassium: 4.1 mEq/L (ref 3.5–5.3)
Sodium: 136 mEq/L (ref 135–145)

## 2013-12-10 LAB — COMPREHENSIVE METABOLIC PANEL WITH GFR
ALT: 45 U/L (ref 0–53)
CO2: 32 meq/L (ref 19–32)
Calcium: 10 mg/dL (ref 8.4–10.5)
Chloride: 96 meq/L (ref 96–112)
Creat: 0.92 mg/dL (ref 0.50–1.35)
Glucose, Bld: 131 mg/dL — ABNORMAL HIGH (ref 70–99)
Total Bilirubin: 0.7 mg/dL (ref 0.3–1.2)
Total Protein: 7.3 g/dL (ref 6.0–8.3)

## 2013-12-10 LAB — LIPID PANEL
Cholesterol: 158 mg/dL (ref 0–200)
HDL: 57 mg/dL (ref >39)
LDL Cholesterol: 84 mg/dL (ref 0–99)
Total CHOL/HDL Ratio: 2.8 Ratio
Triglycerides: 84 mg/dL (ref <150)
VLDL: 17 mg/dL (ref 0–40)

## 2013-12-10 MED ORDER — PHENTERMINE HCL 37.5 MG PO TABS: 18.7500 mg | ORAL_TABLET | Freq: Two times a day (BID) | ORAL | Status: DC

## 2013-12-10 NOTE — Assessment & Plan Note (Signed)
Well controlled. Continue Zocor. 

## 2013-12-10 NOTE — Assessment & Plan Note (Addendum)
Overweight with diabetes. He stopped the phentermine due to erectile dysfunction. He will do one half tab twice a day in hopes of mitigating this side effect. Return in one month.  Persistent erectile dysfunction one half tab twice a day, discontinuing this.

## 2013-12-10 NOTE — Assessment & Plan Note (Signed)
Well controlled, no changes 

## 2013-12-10 NOTE — Progress Notes (Signed)
  Subjective:    CC: Complete physical  HPI: Diabetes: Well controlled.  Hypertension: Well controlled.  Hyperlipidemia: Well controlled.  Overweight: Did not use all of the phentermine because he did have some erectile dysfunction.  Past medical history, Surgical history, Family history not pertinant except as noted below, Social history, Allergies, and medications have been entered into the medical record, reviewed, and no changes needed.   Review of Systems: No fevers, chills, night sweats, weight loss, chest pain, or shortness of breath.   Objective:    General Appearance: Alert, well developed and well nourished, cooperative, no distress.  Head: Normocephalic, no obvious abnormality  Eyes: PERRL, EOM's intact, conjunctiva and corneas clear.  Nose: Nares symmetrical, septum midline, mucosa pink, clear watery discharge; no sinus tenderness  Throat: Lips, tongue, and mucosa are moist, pink, and intact; teeth intact  Neck: Supple, symmetrical, trachea midline, no adenopathy; thyroid: no enlargement, symmetric,no tenderness/mass/nodules; no carotid bruit, no JVD  Back: Symmetrical, no curvature, ROM normal, no CVA tenderness  Chest/Breast: No mass or tenderness  Lungs: Clear to auscultation bilaterally, respirations unlabored  Heart: Normal PMI, no lower extremity edema, regular rate & rhythm, S1 and S2 normal, no murmurs, rubs, or gallops. Abdomen: Soft, non-tender, bowel sounds active all four quadrants, no mass, or organomegaly  Musculoskeletal: All joints, extremities examined, non-tender and unremarkable.  Lymphatic: No adenopathy  Skin/Hair/Nails: Skin warm, dry, and intact, no rashes or abnormal dyspigmentation  Neurologic: Alert and oriented x3, no cranial nerve deficits, normal strength and tone, gait steady   Diabetic foot exam Both feet were examined, there are no signs of infection, ulceration, calluses, corns, skin breaks, or nail disorders. There are good dorsalis  pedis and posterior tibial artery pulses, monofilament test was normal and palpable at all sites of bony prominence. Footwear was in good condition, style, and fit.  Impression and Recommendations:

## 2013-12-10 NOTE — Assessment & Plan Note (Addendum)
1500 mg of metformin twice a day. Previously well controlled. Rechecking hemoglobin A1c.  Switching to Janumet 50//100 BID.

## 2013-12-11 LAB — TSH: TSH: 0.945 u[IU]/mL (ref 0.350–4.500)

## 2013-12-12 ENCOUNTER — Telehealth: Payer: Self-pay

## 2013-12-12 NOTE — Telephone Encounter (Signed)
Error

## 2013-12-13 MED ORDER — SITAGLIPTIN PHOS-METFORMIN HCL 50-1000 MG PO TABS: 1.0000 | ORAL_TABLET | Freq: Two times a day (BID) | ORAL | Status: DC

## 2013-12-13 NOTE — Addendum Note (Signed)
Addended by: Monica BectonHEKKEKANDAM, Earlene Bjelland J on: 12/13/2013 05:11 PM   Modules accepted: Orders, Medications

## 2013-12-14 ENCOUNTER — Telehealth: Payer: Self-pay

## 2013-12-14 ENCOUNTER — Telehealth: Payer: Self-pay | Admitting: *Deleted

## 2013-12-14 MED ORDER — LORCASERIN HCL 10 MG PO TABS: 1.0000 | ORAL_TABLET | Freq: Two times a day (BID) | ORAL | Status: DC

## 2013-12-14 NOTE — Telephone Encounter (Signed)
Patient called stated that the phentermine is what is causing him to have ED. He wants to know if he can a Rx for a weight loss medication. Rhonda Cunningham,CMA

## 2013-12-14 NOTE — Telephone Encounter (Signed)
Pt is asking that you give him a call back in regards to the medication he picked up.  Meyer CoryMisty Ahmad, LPN

## 2013-12-14 NOTE — Telephone Encounter (Signed)
Sounds like he wants to try a different weight loss medicine. He should definitely do the additional diabetes medicine that I called in. I am also going add Belviq, this is likely very expensive, and he should let me know if it is too expensive. Prescription is in my box

## 2013-12-17 NOTE — Telephone Encounter (Signed)
Rx has been faxed to [facility]. Damyn Weitzel,CMA  

## 2013-12-19 ENCOUNTER — Telehealth: Payer: Self-pay

## 2013-12-19 MED ORDER — METFORMIN HCL 1000 MG PO TABS: 1500.0000 mg | ORAL_TABLET | Freq: Two times a day (BID) | ORAL | Status: DC

## 2013-12-19 NOTE — Telephone Encounter (Signed)
Patient states he did not take the Janumet. He is taking metformin 1000 mg 1.5 tablets bid 90 day prescription. He needs a refill. This is not on his medication list. [facility]. Please advise if the refill of the Metformin appropriate.

## 2013-12-19 NOTE — Telephone Encounter (Signed)
Noted, it sounds like he would rather pursue the second week loss medicine before considering changing his diabetes medicine which is appropriate. I'm going to switch him back to metformin 1500 mg twice a day.

## 2014-01-03 ENCOUNTER — Telehealth: Payer: Self-pay | Admitting: *Deleted

## 2014-01-03 NOTE — Telephone Encounter (Signed)
PA approved for Belviq. Approval dates 01/01/14-06/29/14.

## 2014-01-07 ENCOUNTER — Encounter: Payer: Self-pay | Admitting: Sports Medicine

## 2014-01-07 ENCOUNTER — Ambulatory Visit (INDEPENDENT_AMBULATORY_CARE_PROVIDER_SITE_OTHER): Payer: BC Managed Care – PPO | Admitting: Sports Medicine

## 2014-01-07 VITALS — BP 124/81 | HR 79 | Ht 72.0 in | Wt 206.0 lb

## 2014-01-07 DIAGNOSIS — E119 Type 2 diabetes mellitus without complications: Secondary | ICD-10-CM

## 2014-01-07 DIAGNOSIS — I10 Essential (primary) hypertension: Secondary | ICD-10-CM

## 2014-01-07 DIAGNOSIS — E669 Obesity, unspecified: Secondary | ICD-10-CM

## 2014-01-07 DIAGNOSIS — IMO0002 Reserved for concepts with insufficient information to code with codable children: Secondary | ICD-10-CM

## 2014-01-07 NOTE — Assessment & Plan Note (Signed)
Well controlled, no changes 

## 2014-01-07 NOTE — Assessment & Plan Note (Signed)
Moderately well controlled. Continue metformin.

## 2014-01-07 NOTE — Assessment & Plan Note (Addendum)
3 pound weight loss so far with Belviq. I will give him some info on Contrave. Return in 5 months.

## 2014-01-07 NOTE — Progress Notes (Signed)
  Subjective:    CC: Follow up  HPI: Obesity: 3 pound weight loss since the last visit after one month of Belviq.  Diabetes mellitus type 2: Doing well with metformin, checks his feet daily, no adverse effects. Stable.  Hypertension: Well controlled on current medications.  Past medical history, Surgical history, Family history not pertinant except as noted below, Social history, Allergies, and medications have been entered into the medical record, reviewed, and no changes needed.   Review of Systems: No fevers, chills, night sweats, weight loss, chest pain, or shortness of breath.   Objective:    General: Well Developed, well nourished, and in no acute distress.  Neuro: Alert and oriented x3, extra-ocular muscles intact, sensation grossly intact.  HEENT: Normocephalic, atraumatic, pupils equal round reactive to light, neck supple, no masses, no lymphadenopathy, thyroid nonpalpable.  Skin: Warm and dry, no rashes. Cardiac: Regular rate and rhythm, no murmurs rubs or gallops, no lower extremity edema.  Respiratory: Clear to auscultation bilaterally. Not using accessory muscles, speaking in full sentences.  Impression and Recommendations:

## 2014-02-04 ENCOUNTER — Other Ambulatory Visit: Payer: Self-pay | Admitting: Sports Medicine

## 2014-02-05 ENCOUNTER — Other Ambulatory Visit: Payer: Self-pay

## 2014-03-08 ENCOUNTER — Other Ambulatory Visit: Payer: Self-pay | Admitting: Sports Medicine

## 2014-03-08 DIAGNOSIS — E669 Obesity, unspecified: Secondary | ICD-10-CM

## 2014-03-11 ENCOUNTER — Other Ambulatory Visit: Payer: Self-pay

## 2014-03-11 NOTE — Telephone Encounter (Signed)
Danny Schroeder, Rx printed and placed in your inbox ready for pickup or faxing.

## 2014-03-11 NOTE — Telephone Encounter (Signed)
Rx faxed to [facility]. Rhonda Cunningham,CMA  

## 2014-03-11 NOTE — Telephone Encounter (Signed)
Can you look at this please.  Last office visit Dr. Karie Schwalbe states return in 5 months for weight check? Barry DienesKimberly Gordon, LPN

## 2014-03-17 ENCOUNTER — Other Ambulatory Visit: Payer: Self-pay | Admitting: Internal Medicine

## 2014-04-11 ENCOUNTER — Other Ambulatory Visit: Payer: Self-pay | Admitting: Sports Medicine

## 2014-04-19 ENCOUNTER — Other Ambulatory Visit: Payer: Self-pay | Admitting: Sports Medicine

## 2014-06-06 ENCOUNTER — Encounter: Payer: Self-pay | Admitting: Sports Medicine

## 2014-06-06 ENCOUNTER — Ambulatory Visit (INDEPENDENT_AMBULATORY_CARE_PROVIDER_SITE_OTHER): Payer: BC Managed Care – PPO

## 2014-06-06 ENCOUNTER — Ambulatory Visit (INDEPENDENT_AMBULATORY_CARE_PROVIDER_SITE_OTHER): Payer: BC Managed Care – PPO | Admitting: Sports Medicine

## 2014-06-06 VITALS — BP 126/81 | HR 77 | Ht 72.0 in | Wt 200.0 lb

## 2014-06-06 DIAGNOSIS — E119 Type 2 diabetes mellitus without complications: Secondary | ICD-10-CM

## 2014-06-06 DIAGNOSIS — M25512 Pain in left shoulder: Secondary | ICD-10-CM | POA: Insufficient documentation

## 2014-06-06 DIAGNOSIS — M25519 Pain in unspecified shoulder: Secondary | ICD-10-CM

## 2014-06-06 DIAGNOSIS — I10 Essential (primary) hypertension: Secondary | ICD-10-CM

## 2014-06-06 DIAGNOSIS — E785 Hyperlipidemia, unspecified: Secondary | ICD-10-CM

## 2014-06-06 LAB — COMPREHENSIVE METABOLIC PANEL WITH GFR
ALT: 21 U/L (ref 0–53)
Albumin: 4.5 g/dL (ref 3.5–5.2)
CO2: 29 meq/L (ref 19–32)
Chloride: 97 meq/L (ref 96–112)
Potassium: 3.8 meq/L (ref 3.5–5.3)
Sodium: 136 meq/L (ref 135–145)
Total Bilirubin: 0.6 mg/dL (ref 0.2–1.2)
Total Protein: 7.3 g/dL (ref 6.0–8.3)

## 2014-06-06 LAB — CBC
HCT: 38.8 % — ABNORMAL LOW (ref 39.0–52.0)
Hemoglobin: 13.5 g/dL (ref 13.0–17.0)
MCH: 27 pg (ref 26.0–34.0)
MCHC: 34.8 g/dL (ref 30.0–36.0)
MCV: 77.6 fL — ABNORMAL LOW (ref 78.0–100.0)
Platelets: 259 K/uL (ref 150–400)
RBC: 5 MIL/uL (ref 4.22–5.81)
RDW: 14.9 % (ref 11.5–15.5)
WBC: 8.2 10*3/uL (ref 4.0–10.5)

## 2014-06-06 LAB — LIPID PANEL
Cholesterol: 156 mg/dL (ref 0–200)
HDL: 55 mg/dL (ref >39)
LDL Cholesterol: 89 mg/dL (ref 0–99)
Total CHOL/HDL Ratio: 2.8 ratio
Triglycerides: 61 mg/dL (ref <150)
VLDL: 12 mg/dL (ref 0–40)

## 2014-06-06 LAB — COMPREHENSIVE METABOLIC PANEL
AST: 19 U/L (ref 0–37)
Alkaline Phosphatase: 59 U/L (ref 39–117)
BUN: 18 mg/dL (ref 6–23)
Calcium: 9.8 mg/dL (ref 8.4–10.5)
Creat: 0.87 mg/dL (ref 0.50–1.35)
Glucose, Bld: 134 mg/dL — ABNORMAL HIGH (ref 70–99)

## 2014-06-06 LAB — HEMOGLOBIN A1C
Hgb A1c MFr Bld: 6.7 % — ABNORMAL HIGH (ref <5.7)
Mean Plasma Glucose: 146 mg/dL — ABNORMAL HIGH (ref <117)

## 2014-06-06 NOTE — Progress Notes (Signed)
  Subjective:    CC: Followup  HPI: Diabetes mellitus type 2: Has overall been well controlled.  Hypertension: Well controlled  Hyperlipidemia: Well controlled  Overweight: Did not have a good response to previous weight loss medications, with erectile dysfunction on phentermine, and abnormal dreams on Belviq. He has been losing weight on his own.  Left shoulder pain: Occurred after a recent injury where he was placed into a position of abduction and external rotation. He was using an ab roller. He felt his shoulder pop out of place and then pop back in. Currently he has very little pain.  Past medical history, Surgical history, Family history not pertinant except as noted below, Social history, Allergies, and medications have been entered into the medical record, reviewed, and no changes needed.   Review of Systems: No fevers, chills, night sweats, weight loss, chest pain, or shortness of breath.   Objective:    General: Well Developed, well nourished, and in no acute distress.  Neuro: Alert and oriented x3, extra-ocular muscles intact, sensation grossly intact.  HEENT: Normocephalic, atraumatic, pupils equal round reactive to light, neck supple, no masses, no lymphadenopathy, thyroid nonpalpable.  Skin: Warm and dry, no rashes. Cardiac: Regular rate and rhythm, no murmurs rubs or gallops, no lower extremity edema.  Respiratory: Clear to auscultation bilaterally. Not using accessory muscles, speaking in full sentences. Left Shoulder: Inspection reveals no abnormalities, atrophy or asymmetry. Palpation is normal with no tenderness over AC joint or bicipital groove. ROM is full in all planes. Rotator cuff strength normal throughout. No signs of impingement with negative Neer and Hawkin's tests, empty can. Speeds and Yergason's tests normal. Negative O'Brien test, relatively good stability, positive crank test suggestive of an anterior labral pathology, he also had a positive  apprehension sign. Normal scapular function observed. No painful arc and no drop arm sign.  Shoulder x-rays reviewed, there does appear to be a small density in the posterior humeral head on the axillary view that may be suggestive of a Hill-Sachs lesion.  Impression and Recommendations:

## 2014-06-06 NOTE — Assessment & Plan Note (Signed)
Well controlled, no changes 

## 2014-06-06 NOTE — Assessment & Plan Note (Addendum)
Most likely has subluxation event during an injury. Home rehabilitation, x-rays. Return in 4 weeks, MR arthrogram if no better. X-rays do show suggestion of a Hill-Sachs lesion.

## 2014-06-06 NOTE — Assessment & Plan Note (Signed)
Well-controlled, rechecking lipids.

## 2014-06-06 NOTE — Assessment & Plan Note (Signed)
Overall well controlled. On an ACE inhibitor so does not need a urine microalbumin. Rechecking blood work. Return in 6 months.

## 2014-06-15 ENCOUNTER — Other Ambulatory Visit: Payer: Self-pay | Admitting: Internal Medicine

## 2014-07-11 ENCOUNTER — Other Ambulatory Visit: Payer: Self-pay | Admitting: Sports Medicine

## 2014-07-24 ENCOUNTER — Encounter: Payer: Self-pay | Admitting: Sports Medicine

## 2014-07-24 ENCOUNTER — Ambulatory Visit (INDEPENDENT_AMBULATORY_CARE_PROVIDER_SITE_OTHER): Payer: BC Managed Care – PPO | Admitting: Sports Medicine

## 2014-07-24 VITALS — BP 131/86 | HR 76 | Ht 73.0 in | Wt 205.0 lb

## 2014-07-24 DIAGNOSIS — M25519 Pain in unspecified shoulder: Secondary | ICD-10-CM

## 2014-07-24 DIAGNOSIS — Z23 Encounter for immunization: Secondary | ICD-10-CM

## 2014-07-24 DIAGNOSIS — B356 Tinea cruris: Secondary | ICD-10-CM | POA: Diagnosis not present

## 2014-07-24 DIAGNOSIS — M25512 Pain in left shoulder: Secondary | ICD-10-CM

## 2014-07-24 MED ORDER — CLOTRIMAZOLE-BETAMETHASONE 1-0.05 % EX CREA: 1.0000 "application " | TOPICAL_CREAM | Freq: Two times a day (BID) | CUTANEOUS | Status: DC

## 2014-07-24 NOTE — Addendum Note (Signed)
Addended by: Pixie Casino on: 07/24/2014 11:24 AM   Modules accepted: Orders

## 2014-07-24 NOTE — Assessment & Plan Note (Signed)
Lotrisone twice a day.

## 2014-07-24 NOTE — Progress Notes (Signed)
  Subjective:    CC: Followup  HPI: Left shoulder pain: Symptoms were suggestive of a labral injury, he went to 6 weeks of therapy and NSAIDs, continues to have pain. Pain is localized over the posterior joint line with radiation of the deltoid, not worse with overhead activities, it is worse with abduction and external rotation. Moderate, persistent.  He also complains of a pruritic rash that had started in his right groin and scrotum.  Past medical history, Surgical history, Family history not pertinant except as noted below, Social history, Allergies, and medications have been entered into the medical record, reviewed, and no changes needed.   Review of Systems: No fevers, chills, night sweats, weight loss, chest pain, or shortness of breath.   Objective:    General: Well Developed, well nourished, and in no acute distress.  Neuro: Alert and oriented x3, extra-ocular muscles intact, sensation grossly intact.  HEENT: Normocephalic, atraumatic, pupils equal round reactive to light, neck supple, no masses, no lymphadenopathy, thyroid nonpalpable.  Skin: Warm and dry, there is a mildly erythematous, papular, excoriated rash in the right groin. It does extend partially into the scrotum. Cardiac: Regular rate and rhythm, no murmurs rubs or gallops, no lower extremity edema.  Respiratory: Clear to auscultation bilaterally. Not using accessory muscles, speaking in full sentences. Left Shoulder: Inspection reveals no abnormalities, atrophy or asymmetry. Palpation is normal with no tenderness over AC joint or bicipital groove. ROM is full in all planes. Rotator cuff strength normal throughout. No signs of impingement with negative Neer and Hawkin's tests, empty can. Speeds and Yergason's tests normal. Labral pathology is noted, he has a positive crank test, positive clunk test, negative O'Brien's test. Normal scapular function observed. No painful arc and no drop arm sign. No apprehension  sign  Procedure: Real-time Ultrasound Guided Injection of left glenohumeral joint Device: GE Logiq E  Verbal informed consent obtained.  Time-out conducted.  Noted no overlying erythema, induration, or other signs of local infection.  Skin prepped in a sterile fashion.  Local anesthesia: Topical Ethyl chloride.  With sterile technique and under real time ultrasound guidance:  Noted what appeared to be a hypoechoic change in the posterior labrum suggestive of a tear, spinal needle advanced into the glenohumeral joint taking care to avoid further trauma to the labrum, and a total of 1 cc kenalog 40, 4 cc lidocaine injected easily. Completed without difficulty  Pain immediately resolved suggesting accurate placement of the medication.  Advised to call if fevers/chills, erythema, induration, drainage, or persistent bleeding.  Images permanently stored and available for review in the ultrasound unit.  Impression: Technically successful ultrasound guided injection.  Impression and Recommendations:

## 2014-07-24 NOTE — Assessment & Plan Note (Signed)
Symptoms are highly suspicious of a glenoid labral tear not improved with 6 weeks of conservative measures. We did discuss proceeding with MR arthrogram however he would like me to simply inject the shoulder today and if no improvement after a month he is happy to proceed with arthrogram. Glenohumeral injection performed today with complete relief of pain. Call me in one month, and we can order the MRI arthrogram if no improvement in symptoms.

## 2014-09-02 ENCOUNTER — Other Ambulatory Visit: Payer: Self-pay | Admitting: Sports Medicine

## 2014-09-06 ENCOUNTER — Encounter: Payer: Self-pay | Admitting: Sports Medicine

## 2014-09-06 ENCOUNTER — Ambulatory Visit (INDEPENDENT_AMBULATORY_CARE_PROVIDER_SITE_OTHER): Payer: BC Managed Care – PPO | Admitting: Sports Medicine

## 2014-09-06 VITALS — BP 132/86 | HR 68 | Ht 73.0 in | Wt 202.0 lb

## 2014-09-06 DIAGNOSIS — M84375A Stress fracture, left foot, initial encounter for fracture: Secondary | ICD-10-CM

## 2014-09-06 DIAGNOSIS — G5762 Lesion of plantar nerve, left lower limb: Secondary | ICD-10-CM | POA: Insufficient documentation

## 2014-09-06 NOTE — Progress Notes (Signed)
  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 Since then he's developed increasing pain over the second, third, and fourth dorsal metatarsal shafts worse with weightbearing and palpation. Pain is moderate, persistent without radiation.  Past medical history, Surgical history, Family history not pertinant except as noted below, Social history, Allergies, and medications have been entered into the medical record, reviewed, and no changes needed.   Review of Systems: No fevers, chills, night sweats, weight loss, chest pain, or shortness of breath.   Objective:    General: Well Developed, well nourished, and in no acute distress.  Neuro: Alert and oriented x3, extra-ocular muscles intact, sensation grossly intact.  HEENT: Normocephalic, atraumatic, pupils equal round reactive to light, neck supple, no masses, no lymphadenopathy, thyroid nonpalpable.  Skin: Warm and dry, no rashes. Cardiac: Regular rate and rhythm, no murmurs rubs or gallops, no lower extremity edema.  Respiratory: Clear to auscultation bilaterally. Not using accessory muscles, speaking in full sentences. Left Foot: No visible erythema or swelling. Range of motion is full in all directions. Strength is 5/5 in all directions. No hallux valgus. No pes cavus or pes planus. No abnormal callus noted. No pain over the navicular prominence, or base of fifth metatarsal. No tenderness to palpation of the calcaneal insertion of plantar fascia. No pain at the Achilles insertion. No pain over the calcaneal bursa. No pain of the retrocalcaneal bursa. Tender to palpation over the dorsal distal shafts of the second, third, and fourth metatarsals. No hallux rigidus or limitus. No tenderness palpation over interphalangeal joints. No pain with compression of the metatarsal  heads. Neurovascularly intact distally.  Procedure: Diagnostic Ultrasound of  left foot Device: GE Logiq E  Findings: Noted increased power Doppler signal over the periosteum of the second, third, and fourth metatarsal shafts. Images permanently stored and available for review in the ultrasound unit.  Impression: Second, third, fourth metatarsal stress reaction.  Impression and Recommendations:

## 2014-09-06 NOTE — Assessment & Plan Note (Signed)
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 He has developed a second, third, and fourth metatarsal stress fractures. There is significant increased power Doppler signal over the metatarsal shaft. 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 Return to see me for custom orthotics.

## 2014-09-08 ENCOUNTER — Other Ambulatory Visit: Payer: Self-pay | Admitting: Sports Medicine

## 2014-09-17 ENCOUNTER — Ambulatory Visit (INDEPENDENT_AMBULATORY_CARE_PROVIDER_SITE_OTHER): Payer: BC Managed Care – PPO | Admitting: Sports Medicine

## 2014-09-17 ENCOUNTER — Other Ambulatory Visit: Payer: Self-pay | Admitting: Sports Medicine

## 2014-09-17 ENCOUNTER — Encounter: Payer: Self-pay | Admitting: Sports Medicine

## 2014-09-17 VITALS — BP 142/89 | HR 100 | Ht 72.0 in | Wt 206.0 lb

## 2014-09-17 DIAGNOSIS — J209 Acute bronchitis, unspecified: Secondary | ICD-10-CM

## 2014-09-17 DIAGNOSIS — M84375A Stress fracture, left foot, initial encounter for fracture: Secondary | ICD-10-CM

## 2014-09-17 MED ORDER — FLUTICASONE PROPIONATE 50 MCG/ACT NA SUSP: NASAL | Status: DC

## 2014-09-17 MED ORDER — BENZONATATE 200 MG PO CAPS: 200.0000 mg | ORAL_CAPSULE | Freq: Three times a day (TID) | ORAL | Status: DC | PRN

## 2014-09-17 MED ORDER — AZITHROMYCIN 250 MG PO TABS: ORAL_TABLET | ORAL | Status: DC

## 2014-09-17 NOTE — Progress Notes (Signed)
    Patient was fitted for a : standard, cushioned, semi-rigid orthotic. The orthotic was heated and afterward the patient stood on the orthotic blank positioned on the orthotic stand. The patient was positioned in subtalar neutral position and 10 degrees of ankle dorsiflexion in a weight bearing stance. After completion of molding, a stable base was applied to the orthotic blank. The blank was ground to a stable position for weight bearing. Size: 11 Base: [facility] [place] Additional Posting and Padding: Left-sided metatarsal head The patient ambulated these, and they were very comfortable.  I spent 40 minutes with this patient, greater than 50% was face-to-face time counseling regarding the below diagnosis.

## 2014-09-17 NOTE — Assessment & Plan Note (Signed)
Azithromycin, Flonase, Tessalon Perles.

## 2014-09-17 NOTE — Assessment & Plan Note (Signed)
Custom orthotics as above. 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 I have advised to increase compliance with wearing athletic shoes rather than sandals.

## 2014-10-17 ENCOUNTER — Other Ambulatory Visit: Payer: Self-pay | Admitting: Sports Medicine

## 2014-10-28 ENCOUNTER — Encounter: Payer: Self-pay | Admitting: Sports Medicine

## 2014-10-28 ENCOUNTER — Ambulatory Visit (INDEPENDENT_AMBULATORY_CARE_PROVIDER_SITE_OTHER): Payer: BC Managed Care – PPO | Admitting: Sports Medicine

## 2014-10-28 VITALS — BP 139/90 | HR 78 | Ht 72.0 in | Wt 208.0 lb

## 2014-10-28 DIAGNOSIS — M84375D Stress fracture, left foot, subsequent encounter for fracture with routine healing: Secondary | ICD-10-CM | POA: Diagnosis not present

## 2014-10-28 DIAGNOSIS — M7661 Achilles tendinitis, right leg: Secondary | ICD-10-CM | POA: Diagnosis not present

## 2014-10-28 DIAGNOSIS — M25572 Pain in left ankle and joints of left foot: Secondary | ICD-10-CM | POA: Insufficient documentation

## 2014-10-28 MED ORDER — NITROGLYCERIN 0.2 MG/HR TD PT24: MEDICATED_PATCH | TRANSDERMAL | Status: DC

## 2014-10-28 MED ORDER — KETOROLAC TROMETHAMINE 30 MG/ML IJ SOLN
30.0000 mg | Freq: Once | INTRAMUSCULAR | Status: AC
  Administered 2014-10-28: 30 mg via INTRAMUSCULAR

## 2014-10-28 NOTE — Assessment & Plan Note (Signed)
Symptoms resolved with custom orthotics and a metatarsal pad.

## 2014-10-28 NOTE — Progress Notes (Signed)
  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 Pain is moderate, persistent, [place] and needs fairly urgent treatment. Pain is moderate, persistent without radiation.  Past medical history, Surgical history, Family history not pertinant except as noted below, Social history, Allergies, and medications have been entered into the medical record, reviewed, and no changes needed.   Review of Systems: No fevers, chills, night sweats, weight loss, chest pain, or shortness of breath.   Objective:    General: Well Developed, well nourished, and in no acute distress.  Neuro: Alert and oriented x3, extra-ocular muscles intact, sensation grossly intact.  HEENT: Normocephalic, atraumatic, pupils equal round reactive to light, neck supple, no masses, no lymphadenopathy, thyroid nonpalpable.  Skin: Warm and dry, no rashes. Cardiac: Regular rate and rhythm, no murmurs rubs or gallops, no lower extremity edema.  Respiratory: Clear to auscultation bilaterally. Not using accessory muscles, speaking in full sentences. Right Ankle: No visible erythema or swelling. Range of motion is full in all directions. Tender to palpation over the distal Achilles without palpable nodule or evidence of calcaneal or retrocalcaneal bursitis. Strength is 5/5 in all directions. Stable lateral and medial ligaments; squeeze test and kleiger test unremarkable; Talar dome nontender; No pain at base of 5th MT; No tenderness over cuboid; No tenderness over N spot or navicular prominence No tenderness on posterior aspects of lateral and medial malleolus No sign of peroneal tendon subluxations; Negative  tarsal tunnel tinel's Able to walk 4 steps.  Impression and Recommendations:

## 2014-10-28 NOTE — Assessment & Plan Note (Signed)
Heel lift. Toradol 30 IM. Topical nitroglycerin. Home rehabilitation exercises. He will call me on Friday, if no better we will do a burst of prednisone. [place].

## 2014-11-11 ENCOUNTER — Other Ambulatory Visit: Payer: Self-pay | Admitting: *Deleted

## 2014-11-11 MED ORDER — OMEPRAZOLE 20 MG PO CPDR: 20.0000 mg | DELAYED_RELEASE_CAPSULE | Freq: Every day | ORAL | Status: DC

## 2014-11-11 NOTE — Telephone Encounter (Signed)
Rx sent 

## 2014-11-12 ENCOUNTER — Other Ambulatory Visit: Payer: Self-pay

## 2014-11-12 MED ORDER — OMEPRAZOLE 20 MG PO CPDR: 20.0000 mg | DELAYED_RELEASE_CAPSULE | Freq: Every day | ORAL | Status: DC

## 2014-11-24 ENCOUNTER — Emergency Department
Admission: EM | Admit: 2014-11-24 | Discharge: 2014-11-24 | Disposition: A | Discharge status: Home / Self Care | Payer: BC Managed Care – PPO | Source: Home / Self Care | Attending: Family Medicine | Admitting: Family Medicine

## 2014-11-24 ENCOUNTER — Encounter: Payer: Self-pay | Admitting: *Deleted

## 2014-11-24 ENCOUNTER — Emergency Department (INDEPENDENT_AMBULATORY_CARE_PROVIDER_SITE_OTHER): Payer: BC Managed Care – PPO

## 2014-11-24 DIAGNOSIS — M7671 Peroneal tendinitis, right leg: Secondary | ICD-10-CM

## 2014-11-24 DIAGNOSIS — M79671 Pain in right foot: Secondary | ICD-10-CM

## 2014-11-24 MED ORDER — MELOXICAM 15 MG PO TABS: 15.0000 mg | ORAL_TABLET | Freq: Every day | ORAL | Status: DC

## 2014-11-24 NOTE — ED Notes (Signed)
Pt has had intermittent R heel pain for 1 day.  Pain is 10/10 when it happens and takes his breath away.  He has tried heat, ice, nitroglycerin patches, and elevation with no relief.

## 2014-11-24 NOTE — ED Provider Notes (Signed)
CSN: [id number]     Arrival date & time 11/24/14  1110 History   First MD Initiated Contact with Patient 11/24/14 1214     Chief Complaint  Patient presents with  . Foot Pain    R      HPI Comments: Patient complains of one day history of pain in his right heel, worse at night and somewhat better when walking.  He recalls no injury or change in physical activities.  He has been treated for right achilles tendonitis, and has had a stress fracture in his left foot.  Patient is a 43 y.o. male presenting with lower extremity pain. The history is provided by the patient.  Foot Pain This is a new problem. The current episode started yesterday. The problem occurs constantly. The problem has not changed since onset.Associated symptoms comments: none. Exacerbated by: rest. The symptoms are relieved by walking. Treatments tried: ice pack, heat, and Ibuprofen. The treatment provided mild relief.    Past Medical History  Diagnosis Date  . Hyperlipidemia   . Hypertension   . Diabetes mellitus   . GERD (gastroesophageal reflux disease)    Past Surgical History  Procedure Laterality Date  . Knee surgery  2005    right   Family History  Problem Relation Age of Onset  . Heart disease Mother   . Diabetes Mother   . Hypertension Mother   . Heart disease Father 1239    CABG  . Diabetes Sister   . Hypertension Sister    History  Substance Use Topics  . Smoking status: Former Smoker    Quit date: 03/06/1993  . Smokeless tobacco: Never Used  . Alcohol Use: Yes     Comment: rarely    Review of Systems  All other systems reviewed and are negative.   Allergies  Phentermine  Home Medications   Prior to Admission medications   Medication Sig Start Date End Date Taking? Authorizing Provider  aspirin EC 81 MG tablet Take 1 tablet (81 mg total) by mouth daily. 03/15/13   Monica Bectonhomas J Thekkekandam, MD  clotrimazole-betamethasone (LOTRISONE) cream Apply 1 application topically 2 (two) times daily.  07/24/14   Monica Bectonhomas J Thekkekandam, MD  fluticasone (FLONASE) 50 MCG/ACT nasal spray One spray in each nostril twice a day, use left hand for right nostril, and right hand for left nostril. 09/17/14   Monica Bectonhomas J Thekkekandam, MD  lisinopril-hydrochlorothiazide (PRINZIDE,ZESTORETIC) 20-25 MG per tablet TAKE ONE TABLET BY MOUTH ONE TIME DAILY  10/17/14   Monica Bectonhomas J Thekkekandam, MD  meloxicam (MOBIC) 15 MG tablet Take 1 tablet (15 mg total) by mouth daily. Take with food each evening 11/24/14   Lattie HawStephen A Beese, MD  metFORMIN (GLUCOPHAGE) 1000 MG tablet TAKE ONE AND ONE-HALF TABLETS BY MOUTH TWICE DAILY  09/08/14   Monica Bectonhomas J Thekkekandam, MD  nitroGLYCERIN (NITRODUR - DOSED IN MG/24 HR) 0.2 mg/hr patch Cut and apply 1/4 patch to most painful area q24h. 10/28/14   Monica Bectonhomas J Thekkekandam, MD  omeprazole (PRILOSEC) 20 MG capsule Take 1 capsule (20 mg total) by mouth daily. 11/12/14   Monica Bectonhomas J Thekkekandam, MD  simvastatin (ZOCOR) 20 MG tablet TAKE ONE TABLET BY MOUTH NIGHTLY AT BEDTIME  10/17/14   Monica Bectonhomas J Thekkekandam, MD   BP 142/88 mmHg  Pulse 88  Temp(Src) 98.2 F (36.8 C) (Oral)  Ht 6' (1.829 m)  Wt 205 lb (92.987 kg)  BMI 27.80 kg/m2  SpO2 100% Physical Exam  Constitutional: He is oriented to person, place, and time.  He appears well-developed and well-nourished. No distress.  HENT:  Head: Atraumatic.  Eyes: Pupils are equal, round, and reactive to light.  Musculoskeletal:       Right foot: There is tenderness and bony tenderness. There is normal range of motion, no swelling, normal capillary refill, no crepitus, no deformity and no laceration.       Feet:  Right foot reveals distinct tenderness over the lateral aspect of cuboid and tuberosity of the 5th metatarsal  as noted on diagram.  Pain is elicited during resisted plantar flexion and eversion while palpating those areas.    Neurological: He is alert and oriented to person, place, and time.  Skin: Skin is warm and dry.  Nursing note and  vitals reviewed.   ED Course  Procedures  none    Imaging Review Dg Foot Complete Right  11/24/2014   CLINICAL DATA:  Pain lateral foot towards cuboid bone and fifth metatarsal 1 day. No injury.  EXAM: RIGHT FOOT COMPLETE - 3+ VIEW  COMPARISON:  None.  FINDINGS: There is no evidence of fracture or dislocation. There is no evidence of arthropathy or other focal bone abnormality. Soft tissues are unremarkable.  IMPRESSION: Negative.   Electronically Signed   By: Elberta Fortisaniel  Boyle M.D.   On: 11/24/2014 13:03     MDM   1. Peroneal tendonitis, right   2. Foot pain, right        Aircast splint applied.  Begin Mobic for one week.  May continue nitroglycerin patches. Wear AirCast splint daytime.  Apply ice pack for 20 to 30 minutes, 3 to 4 times daily  Continue until pain decreases.  Begin exercises as per instruction sheet.  May take Tylenol at bedtime for pain as needed. Followup with Dr. Tyson Babinskihomas Thekkekandam     Stephen A Beese, MD 11/25/14 [phone]

## 2014-11-24 NOTE — Discharge Instructions (Signed)
Wear AirCast splint daytime.  Apply ice pack for 20 to 30 minutes, 3 to 4 times daily  Continue until pain decreases.  Begin exercises as per instruction sheet.  May take Tylenol at bedtime for pain as needed.   Peroneal Tendinitis with Rehab Tendonitis is inflammation of a tendon. Inflammation of the tendons on the back of the outer ankle (peroneal tendons) is known as peroneal tendonitis. The peroneal tendons are responsible for connecting the muscles that allow you to stand on your tiptoes to the bones of the ankle. For this reason, peroneal tendonitis often causes pain when trying to complete such motions. Peroneal tendonitis often involves a tear (strain) of the peroneal tendons. Strains are classified into three categories. Grade 1 strains cause pain, but the tendon is not lengthened. Grade 2 strains include a lengthened ligament, due to the ligament being stretched or partially ruptured. With grade 2 strains there is still function, although function may be decreased. Grade 3 strains involve a complete tear of the tendon or muscle, and function is usually impaired. SYMPTOMS   Pain, tenderness, swelling, warmth, or redness over the back of the outer side of the ankle, the outer part of the mid-foot, or the bottom of the arch.  Pain that gets worse with ankle motion (especially when pushing off or pushing down with the front of the foot), or when standing on the ball of the foot or pushing the foot outward.  Crackling sound (crepitation) when the tendon is moved or touched. CAUSES  Peroneal tendinitis occurs when injury to the peroneal tendons causes the body to respond with inflammation. Common causes of injury include:  An overuse injury, in which the groove behind the outer ankle (where the tendon is located) causes wear on the tendon.  A sudden stress placed on the tendon, such as from an increase in the intensity, frequency, or duration of training.  Direct hit (trauma) to the  tendon.  Return to activity too soon after a previous ankle injury. RISK INCREASES WITH:  Sports that require sudden, repetitive pushing off of the foot, such as jumping or quick starts.  Kicking and running sports, especially running down hills or long distances.  Poor strength and flexibility.  Previous injury to the foot, ankle, or leg. PREVENTION  Warm up and stretch properly before activity.  Allow for adequate recovery between workouts.  Maintain physical fitness:  Strength, flexibility, and endurance.  Cardiovascular fitness.  Complete rehabilitation after previous injury. PROGNOSIS  If treated properly, peroneal tendonitis usually heals within 6 weeks.  RELATED COMPLICATIONS  Longer healing time, if not properly treated or if not given enough time to heal.  Recurring symptoms if activity is resumed too soon, with overuse, or when using poor technique.  If untreated, tendinitis may result in tendon rupture, requiring surgery. TREATMENT  Treatment first involves the use of ice and medicine to reduce pain and inflammation. The use of strengthening and stretching exercises may help reduce pain with activity. These exercises may be performed at home or with a therapist. Sometimes, the foot and ankle will be restrained for 10 to 14 days to promote healing. Your caregiver may advise that you place a heel lift in your shoes to reduce the stress placed on the tendon. If nonsurgical treatment is unsuccessful, surgery to remove the inflamed tendon lining (sheath) may be advised.  MEDICATION   If pain medicine is needed, nonsteroidal anti-inflammatory medicines (aspirin and ibuprofen), or other minor pain relievers (acetaminophen), are often advised.  Do  not take pain medicine for 7 days before surgery.  Prescription pain relievers may be given, if your caregiver thinks they are needed. Use only as directed and only as much as you need. HEAT AND COLD  Cold treatment (icing)  should be applied for 10 to 15 minutes every 2 to 3 hours for inflammation and pain, and immediately after activity that aggravates your symptoms. Use ice packs or an ice massage.  Heat treatment may be used before performing stretching and strengthening activities prescribed by your caregiver, physical therapist, or athletic trainer. Use a heat pack or a warm water soak. SEEK MEDICAL CARE IF:  Symptoms get worse or do not improve in 2 to 4 weeks, despite treatment.  New, unexplained symptoms develop. (Drugs used in treatment may produce side effects.) EXERCISES RANGE OF MOTION (ROM) AND STRETCHING EXERCISES - Peroneal Tendinitis These exercises may help you when beginning to rehabilitate your injury. Your symptoms may resolve with or without further involvement from your physician, physical therapist or athletic trainer. While completing these exercises, remember:   Restoring tissue flexibility helps normal motion to return to the joints. This allows healthier, less painful movement and activity.  An effective stretch should be held for at least 30 seconds.  A stretch should never be painful. You should only feel a gentle lengthening or release in the stretched tissue. RANGE OF MOTION - Ankle Eversion  Sit with your right / left ankle crossed over your opposite knee.  Grip your foot with your opposite hand, placing your thumb on the top of your foot and your fingers across the bottom of your foot.  Gently push your foot downward with a slight rotation, so your littlest toes rise slightly toward the ceiling.  You should feel a gentle stretch on the inside of your ankle. Hold the stretch for __________ seconds. Repeat __________ times. Complete this exercise __________ times per day.  RANGE OF MOTION - Ankle Inversion  Sit with your right / left ankle crossed over your opposite knee.  Grip your foot with your opposite hand, placing your thumb on the bottom of your foot and your fingers  across the top of your foot.  Gently pull your foot so the smallest toe comes toward you and your thumb pushes the inside of the ball of your foot away from you.  You should feel a gentle stretch on the outside of your ankle. Hold the stretch for __________ seconds. Repeat __________ times. Complete this exercise __________ times per day.  RANGE OF MOTION - Ankle Plantar Flexion  Sit with your right / left leg crossed over your opposite knee.  Use your opposite hand to pull the top of your foot and toes toward you.  You should feel a gentle stretch on the top of your foot and ankle. Hold this position for __________ seconds. Repeat __________ times. Complete __________ times per day.  STRETCH - Gastroc, Standing  Place your hands on a wall.  Extend your right / left leg behind you, keeping the front knee somewhat bent.  Slightly point your toes inward on your back foot.  Keeping your right / left heel on the floor and your knee straight, shift your weight toward the wall, not allowing your back to arch.  You should feel a gentle stretch in the calf. Hold this position for __________ seconds. Repeat __________ times. Complete this stretch __________ times per day. STRETCH - Soleus, Standing  Place your hands on a wall.  Extend your right /  left leg behind you, keeping the other knee somewhat bent.  Slightly point your toes inward on your back foot.  Keep your heel on the floor, bend your back knee, and slightly shift your weight over the back leg so that you feel a gentle stretch deep in your back calf.  Hold this position for __________ seconds. Repeat __________ times. Complete this stretch __________ times per day. STRETCH - Gastrocsoleus, Standing Note: This exercise can place a lot of stress on your foot and ankle. Please complete this exercise only if specifically instructed by your caregiver.   Place the ball of your right / left foot on a step, keeping your other foot  firmly on the same step.  Hold on to the wall or a rail for balance.  Slowly lift your other foot, allowing your body weight to press your heel down over the edge of the step.  You should feel a stretch in your right / left calf.  Hold this position for __________ seconds.  Repeat this exercise with a slight bend in your knee. Repeat __________ times. Complete this stretch __________ times per day.  STRENGTHENING EXERCISES - Peroneal Tendinitis  These exercises may help you when beginning to rehabilitate your injury. They may resolve your symptoms with or without further involvement from your physician, physical therapist or athletic trainer. While completing these exercises, remember:   Muscles can gain both the endurance and the strength needed for everyday activities through controlled exercises.  Complete these exercises as instructed by your physician, physical therapist or athletic trainer. Increase the resistance and repetitions only as guided by your caregiver. STRENGTH - Dorsiflexors  Secure a rubber exercise band or tubing to a fixed object (table, pole) and loop the other end around your right / left foot.  Sit on the floor facing the fixed object. The band should be slightly tense when your foot is relaxed.  Slowly draw your foot back toward you, using your ankle and toes.  Hold this position for __________ seconds. Slowly release the tension in the band and return your foot to the starting position. Repeat __________ times. Complete this exercise __________ times per day.  STRENGTH - Towel Curls  Sit in a chair, on a non-carpeted surface.  Place your foot on a towel, keeping your heel on the floor.  Pull the towel toward your heel only by curling your toes. Keep your heel on the floor.  If instructed by your physician, physical therapist or athletic trainer, add weight to the end of the towel. Repeat __________ times. Complete this exercise __________ times per  day. STRENGTH - Ankle Eversion   Secure one end of a rubber exercise band or tubing to a fixed object (table, pole). Loop the other end around your foot, just before your toes.  Place your fists between your knees. This will focus your strengthening at your ankle.  Drawing the band across your opposite foot, away from the pole, slowly, pull your little toe out and up. Make sure the band is positioned to resist the entire motion.  Hold this position for __________ seconds.  Have your muscles resist the band, as it slowly pulls your foot back to the starting position. Repeat __________ times. Complete this exercise __________ times per day.  Document Released: 11/15/2005 Document Revised: 04/01/2014 Document Reviewed: 02/27/2009 [employer] [facility] Patient Information 2015 [employer] [place]. This information is not intended to replace advice given to you by your health care provider. Make sure you discuss any questions you have  with your health care provider.

## 2014-12-23 ENCOUNTER — Encounter: Payer: Self-pay | Admitting: Sports Medicine

## 2014-12-23 ENCOUNTER — Ambulatory Visit (INDEPENDENT_AMBULATORY_CARE_PROVIDER_SITE_OTHER): Payer: BLUE CROSS/BLUE SHIELD | Admitting: Sports Medicine

## 2014-12-23 VITALS — BP 151/98 | HR 69 | Ht 72.0 in | Wt 210.0 lb

## 2014-12-23 DIAGNOSIS — I10 Essential (primary) hypertension: Secondary | ICD-10-CM | POA: Diagnosis not present

## 2014-12-23 DIAGNOSIS — E1142 Type 2 diabetes mellitus with diabetic polyneuropathy: Secondary | ICD-10-CM | POA: Diagnosis not present

## 2014-12-23 MED ORDER — DAPAGLIFLOZIN PRO-METFORMIN ER 10-1000 MG PO TB24: 1.0000 | ORAL_TABLET | Freq: Every day | ORAL | Status: DC

## 2014-12-23 MED ORDER — GABAPENTIN 300 MG PO CAPS: ORAL_CAPSULE | ORAL | Status: DC

## 2014-12-23 NOTE — Progress Notes (Signed)
  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 Most recent hemoglobin A1c was in the sixes. Unfortunately he's noted increasing burning on the dorsum of his left foot at the first metatarsophalangeal joint and on the right foot the posterior lateral foot. Moderate, persistent, very little pain. He also has some numbness on the bottom of the left foot.  In the past lumbar spine x-rays have been negative.  Past medical history, Surgical history, Family history not pertinant except as noted below, Social history, Allergies, and medications have been entered into the medical record, reviewed, and no changes needed.   Review of Systems: No fevers, chills, night sweats, weight loss, chest pain, or shortness of breath.   Objective:    General: Well Developed, well nourished, and in no acute distress.  Neuro: Alert and oriented x3, extra-ocular muscles intact, sensation grossly intact.  HEENT: Normocephalic, atraumatic, pupils equal round reactive to light, neck supple, no masses, no lymphadenopathy, thyroid nonpalpable.  Skin: Warm and dry, no rashes. Cardiac: Regular rate and rhythm, no murmurs rubs or gallops, no lower extremity edema.  Respiratory: Clear to auscultation bilaterally. Not using accessory muscles, speaking in full sentences. Bilateral Foot: No visible erythema or swelling. Range of motion is full in all directions. Strength is 5/5 in all directions. No hallux valgus. No pes cavus or pes planus. No abnormal callus noted. No pain over the navicular prominence, or base of fifth metatarsal. No tenderness to palpation of the calcaneal insertion of plantar fascia. No pain at the Achilles insertion. No pain over the calcaneal bursa. No pain of the retrocalcaneal bursa. No tenderness to palpation over  the tarsals, metatarsals, or phalanges. No hallux rigidus or limitus. No tenderness palpation over interphalangeal joints. No pain with compression of the metatarsal heads. Neurovascularly intact distally.   Hemoglobin A1c has increased to 7.  Impression and Recommendations:  I spent 40 minutes with this patient, greater than 50% was face-to-face time counseling regarding the below diagnosis

## 2014-12-23 NOTE — Patient Instructions (Signed)
Diabetic Neuropathy Diabetic neuropathy is a nerve disease or nerve damage that is caused by diabetes mellitus. About half of all people with diabetes mellitus have some form of nerve damage. Nerve damage is more common in those who have had diabetes mellitus for many years and who generally have not had good control of their blood sugar (glucose) level. Diabetic neuropathy is a common complication of diabetes mellitus. There are three more common types of diabetic neuropathy and a fourth type that is less common and less understood:   Peripheral neuropathy--This is the most common type of diabetic neuropathy. It causes damage to the nerves of the feet and legs first and then eventually the hands and arms.The damage affects the ability to sense touch.  Autonomic neuropathy--This type causes damage to the autonomic nervous system, which controls the following functions:  Heartbeat.  Body temperature.  Blood pressure.  Urination.  Digestion.  Sweating.  Sexual function.  Focal neuropathy--Focal neuropathy can be painful and unpredictable and occurs most often in older adults with diabetes mellitus. It involves a specific nerve or one area and often comes on suddenly. It usually does not cause long-term problems.  Radiculoplexus neuropathy-- Sometimes called lumbosacral radiculoplexus neuropathy, radiculoplexus neuropathy affects the nerves of the thighs, hips, buttocks, or legs. It is more common in people with type 2 diabetes mellitus and in older men. It is characterized by debilitating pain, weakness, and atrophy, usually in the thigh muscles. CAUSES  The cause of peripheral, autonomic, and focal neuropathies is diabetes mellitus that is uncontrolled and high glucose levels. The cause of radiculoplexus neuropathy is unknown. However, it is thought to be caused by inflammation related to uncontrolled glucose levels. SIGNS AND SYMPTOMS  Peripheral Neuropathy Peripheral neuropathy develops  slowly over time. When the nerves of the feet and legs no longer work there may be:   Burning, stabbing, or aching pain in the legs or feet.  Inability to feel pressure or pain in your feet. This can lead to:  Thick calluses over pressure areas.  Pressure sores.  Ulcers.  Foot deformities.  Reduced ability to feel temperature changes.  Muscle weakness. Autonomic Neuropathy The symptoms of autonomic neuropathy vary depending on which nerves are affected. Symptoms may include:  Problems with digestion, such as:  Feeling sick to your stomach (nausea).  Vomiting.  Bloating.  Constipation.  Diarrhea.  Abdominal pain.  Difficulty with urination. This occurs if you lose your ability to sense when your bladder is full. Problems include:  Urine leakage (incontinence).  Inability to empty your bladder completely (retention).  Rapid or irregular heartbeat (palpitations).  Blood pressure drops when you stand up (orthostatic hypotension). When you stand up you may feel:  Dizzy.  Weak.  Faint.  In men, inability to attain and maintain an erection.  In women, vaginal dryness and problems with decreased sexual desire and arousal.  Problems with body temperature regulation.  Increased or decreased sweating. Focal Neuropathy  Abnormal eye movements or abnormal alignment of both eyes.  Weakness in the wrist.  Foot drop. This results in an inability to lift the foot properly and abnormal walking or foot movement.  Paralysis on one side of your face (Bell palsy).  Chest or abdominal pain. Radiculoplexus Neuropathy  Sudden, severe pain in your hip, thigh, or buttocks.  Weakness and wasting of thigh muscles.  Difficulty rising from a seated position.  Abdominal swelling.  Unexplained weight loss (usually more than 10 lb [4.5 kg]). DIAGNOSIS  Peripheral Neuropathy Your senses may  be tested. Sensory function testing can be done with:  A light touch using a  monofilament.  A vibration with tuning fork.  A sharp sensation with a pin prick. Other tests that can help diagnose neuropathy are:  Nerve conduction velocity. This test checks the transmission of an electrical current through a nerve.  Electromyography. This shows how muscles respond to electrical signals transmitted by nearby nerves.  Quantitative sensory testing. This is used to assess how your nerves respond to vibrations and changes in temperature. Autonomic Neuropathy Diagnosis is often based on reported symptoms. Tell your health care provider if you experience:   Dizziness.   Constipation.   Diarrhea.   Inappropriate urination or inability to urinate.   Inability to get or maintain an erection.  Tests that may be done include:   Electrocardiography or Holter monitor. These are tests that can help show problems with the heart rate or heart rhythm.   An X-ray exam may be done. Focal Neuropathy Diagnosis is made based on your symptoms and what your health care provider finds during your exam. Other tests may be done. They may include:  Nerve conduction velocities. This checks the transmission of electrical current through a nerve.  Electromyography. This shows how muscles respond to electrical signals transmitted by nearby nerves.  Quantitative sensory testing. This test is used to assess how your nerves respond to vibration and changes in temperature. Radiculoplexus Neuropathy  Often the first thing is to eliminate any other issue or problems that might be the cause, as there is no stick test for diagnosis.  X-ray exam of your spine and lumbar region.  Spinal tap to rule out cancer.  MRI to rule out other lesions. TREATMENT  Once nerve damage occurs, it cannot be reversed. The goal of treatment is to keep the disease or nerve damage from getting worse and affecting more nerve fibers. Controlling your blood glucose level is the key. Most people with  radiculoplexus neuropathy see at least a partial improvement over time. You will need to keep your blood glucose and HbA1c levels in the target range determined by your health care provider. Things that help control blood glucose levels include:   Blood glucose monitoring.   Meal planning.   Physical activity.   Diabetes medicine.  Over time, maintaining lower blood glucose levels helps lessen symptoms. Sometimes, prescription pain medicine is needed. HOME CARE INSTRUCTIONS:  Do not smoke.  Keep your blood glucose level in the range that you and your health care provider have determined acceptable for you.  Keep your blood pressure level in the range that you and your health care provider have determined acceptable for you.  Eat a well-balanced diet.  Be active every day.  Check your feet every day. SEEK MEDICAL CARE IF:   You have burning, stabbing, or aching pain in the legs or feet.  You are unable to feel pressure or pain in your feet.  You develop problems with digestion such as:  Nausea.  Vomiting.  Bloating.  Constipation.  Diarrhea.  Abdominal pain.  You have difficulty with urination, such as:  Incontinence.  Retention.  You have palpitations.  You develop orthostatic hypotension. When you stand up you may feel:  Dizzy.  Weak.  Faint.  You cannot attain and maintain an erection (in men).  You have vaginal dryness and problems with decreased sexual desire and arousal (in women).  You have severe pain in your thighs, legs, or buttocks.  You have unexplained weight loss.  Document Released: 01/24/2002 Document Revised: 09/05/2013 Document Reviewed: 04/26/2013 [employer] Patient Information 2015 [employer]. This information is not intended to replace advice given to you by your health care provider. Make sure you discuss any questions you have with your health care provider. 

## 2014-12-23 NOTE — Assessment & Plan Note (Signed)
Blood pressure is elevated today, I do expect some improvement with starting XigDuo.

## 2014-12-23 NOTE — Assessment & Plan Note (Addendum)
Now with a burning sensation over the dorsum of his foot and at the ball of his foot bilaterally. I do suspect this is an element of diabetic peripheral neuropathy. Switch to XigDuo Adding gabapentin.  if persistent symptoms we will do a nerve conduction study, and consider further investigation of his lumbar  spine.

## 2015-01-28 ENCOUNTER — Encounter: Payer: Self-pay | Admitting: Sports Medicine

## 2015-01-28 ENCOUNTER — Ambulatory Visit (INDEPENDENT_AMBULATORY_CARE_PROVIDER_SITE_OTHER): Payer: BLUE CROSS/BLUE SHIELD | Admitting: Sports Medicine

## 2015-01-28 VITALS — BP 123/80 | HR 57 | Ht 72.0 in | Wt 203.0 lb

## 2015-01-28 DIAGNOSIS — E1142 Type 2 diabetes mellitus with diabetic polyneuropathy: Secondary | ICD-10-CM | POA: Diagnosis not present

## 2015-01-28 LAB — POCT GLYCOSYLATED HEMOGLOBIN (HGB A1C): Hemoglobin A1C: 7.9

## 2015-01-28 NOTE — Assessment & Plan Note (Signed)
Doing well with XigDuo. We are going to recheck a hemoglobin A1c per patient request. He is currently doing gabapentin 3 times a day, we are going to switch to 600 mg in the morning, 300 at lunch, and 600 at bedtime. Continue up taper until symptoms are well controlled and then we can switch to a larger dose versus extended release. Return in 2 months.

## 2015-01-28 NOTE — Progress Notes (Signed)
  Subjective:    CC:  Follow-up  HPI: Diabetic peripheral neuropathy: we have started XigDuo, he's continued some of his metformin. Symptoms have improved significantly with gabapentin, he is currently doing 300 mg 3 times a day, and notes some weaning of effect after approximately 6 hours. He is amenable to increase the dose for now until he finds a good regimen, at which point he is agreeable 2 switching to an extended release regimen. He does note polyuria with the new medication.  Hypertension: resolved with the addition of XigDuo.  Past medical history, Surgical history, Family history not pertinant except as noted below, Social history, Allergies, and medications have been entered into the medical record, reviewed, and no changes needed.   Review of Systems: No fevers, chills, night sweats, weight loss, chest pain, or shortness of breath.   Objective:    General: Well Developed, well nourished, and in no acute distress.  Neuro: Alert and oriented x3, extra-ocular muscles intact, sensation grossly intact.  HEENT: Normocephalic, atraumatic, pupils equal round reactive to light, neck supple, no masses, no lymphadenopathy, thyroid nonpalpable.  Skin: Warm and dry, no rashes. Cardiac: Regular rate and rhythm, no murmurs rubs or gallops, no lower extremity edema.  Respiratory: Clear to auscultation bilaterally. Not using accessory muscles, speaking in full sentences.  Hemoglobin A1c interestingly increased to 7.9. Patient did desire that we check this early.  Impression and Recommendations:

## 2015-01-29 ENCOUNTER — Emergency Department (HOSPITAL_BASED_OUTPATIENT_CLINIC_OR_DEPARTMENT_OTHER)
Admission: EM | Admit: 2015-01-29 | Discharge: 2015-01-29 | Disposition: A | Discharge status: Home / Self Care | Payer: BLUE CROSS/BLUE SHIELD | Attending: Emergency Medicine | Admitting: Emergency Medicine

## 2015-01-29 ENCOUNTER — Encounter (HOSPITAL_BASED_OUTPATIENT_CLINIC_OR_DEPARTMENT_OTHER): Payer: Self-pay | Admitting: *Deleted

## 2015-01-29 ENCOUNTER — Telehealth: Payer: Self-pay

## 2015-01-29 DIAGNOSIS — Z7982 Long term (current) use of aspirin: Secondary | ICD-10-CM | POA: Insufficient documentation

## 2015-01-29 DIAGNOSIS — K219 Gastro-esophageal reflux disease without esophagitis: Secondary | ICD-10-CM | POA: Insufficient documentation

## 2015-01-29 DIAGNOSIS — I10 Essential (primary) hypertension: Secondary | ICD-10-CM | POA: Insufficient documentation

## 2015-01-29 DIAGNOSIS — Z791 Long term (current) use of non-steroidal anti-inflammatories (NSAID): Secondary | ICD-10-CM | POA: Diagnosis not present

## 2015-01-29 DIAGNOSIS — Z79899 Other long term (current) drug therapy: Secondary | ICD-10-CM | POA: Insufficient documentation

## 2015-01-29 DIAGNOSIS — R112 Nausea with vomiting, unspecified: Secondary | ICD-10-CM | POA: Diagnosis not present

## 2015-01-29 DIAGNOSIS — Z87891 Personal history of nicotine dependence: Secondary | ICD-10-CM | POA: Insufficient documentation

## 2015-01-29 DIAGNOSIS — E119 Type 2 diabetes mellitus without complications: Secondary | ICD-10-CM | POA: Insufficient documentation

## 2015-01-29 DIAGNOSIS — R61 Generalized hyperhidrosis: Secondary | ICD-10-CM | POA: Diagnosis not present

## 2015-01-29 DIAGNOSIS — E785 Hyperlipidemia, unspecified: Secondary | ICD-10-CM | POA: Diagnosis not present

## 2015-01-29 DIAGNOSIS — R42 Dizziness and giddiness: Secondary | ICD-10-CM | POA: Diagnosis present

## 2015-01-29 LAB — BASIC METABOLIC PANEL
Anion gap: 3 — ABNORMAL LOW (ref 5–15)
BUN: 16 mg/dL (ref 6–23)
CO2: 28 mmol/L (ref 19–32)
CREATININE: 0.83 mg/dL (ref 0.50–1.35)
Calcium: 9 mg/dL (ref 8.4–10.5)
Chloride: 104 mmol/L (ref 96–112)
GFR calc non Af Amer: >90 mL/min (ref >90)
Glucose, Bld: 158 mg/dL — ABNORMAL HIGH (ref 70–99)
POTASSIUM: 3.5 mmol/L (ref 3.5–5.1)
Sodium: 135 mmol/L (ref 135–145)

## 2015-01-29 LAB — CBC WITH DIFFERENTIAL/PLATELET
BASOS ABS: 0 10*3/uL (ref 0.0–0.1)
Basophils Relative: 0 % (ref 0–1)
EOS PCT: 1 % (ref 0–5)
Eosinophils Absolute: 0.1 10*3/uL (ref 0.0–0.7)
HCT: 41.9 % (ref 39.0–52.0)
Hemoglobin: 14 g/dL (ref 13.0–17.0)
LYMPHS PCT: 15 % (ref 12–46)
Lymphs Abs: 1.7 10*3/uL (ref 0.7–4.0)
MCH: 27.2 pg (ref 26.0–34.0)
MCHC: 33.4 g/dL (ref 30.0–36.0)
MCV: 81.4 fL (ref 78.0–100.0)
MONO ABS: 0.5 10*3/uL (ref 0.1–1.0)
MONOS PCT: 5 % (ref 3–12)
Neutro Abs: 9 10*3/uL — ABNORMAL HIGH (ref 1.7–7.7)
Neutrophils Relative %: 79 % — ABNORMAL HIGH (ref 43–77)
Platelets: 303 10*3/uL (ref 150–400)
RBC: 5.15 MIL/uL (ref 4.22–5.81)
RDW: 13.5 % (ref 11.5–15.5)
WBC: 11.3 10*3/uL — ABNORMAL HIGH (ref 4.0–10.5)

## 2015-01-29 LAB — CBG MONITORING, ED: Glucose-Capillary: 144 mg/dL — ABNORMAL HIGH (ref 70–99)

## 2015-01-29 MED ORDER — MECLIZINE HCL 25 MG PO TABS
25.0000 mg | ORAL_TABLET | Freq: Once | ORAL | Status: AC
  Administered 2015-01-29: 25 mg via ORAL
  Filled 2015-01-29: qty 1

## 2015-01-29 MED ORDER — SODIUM CHLORIDE 0.9 % IV BOLUS (SEPSIS)
1000.0000 mL | Freq: Once | INTRAVENOUS | Status: AC
  Administered 2015-01-29: 1000 mL via INTRAVENOUS

## 2015-01-29 MED ORDER — MECLIZINE HCL 25 MG PO TABS: 25.0000 mg | ORAL_TABLET | Freq: Four times a day (QID) | ORAL | Status: DC | PRN

## 2015-01-29 NOTE — Telephone Encounter (Signed)
Patient reports 2 episodes of cold sweats, vomiting and falling. The first episode happened 3 weeks ago and the 2 nd was yesterday. He did not check his blood glucose or blood pressure. I have scheduled him for an office visit.

## 2015-01-29 NOTE — Telephone Encounter (Signed)
Thank you, he needs to stay hydrated, we will discuss this at his next visit.

## 2015-01-29 NOTE — ED Notes (Signed)
Reports dizziness x 3 weeks- first episode while flying, second was yesterday while watching tv;  third episode today after waking from nap- reports "everything started spinning"- also reports felt clammy

## 2015-01-29 NOTE — ED Provider Notes (Addendum)
CSN: [id number]     Arrival date & time 01/29/15  2025 History  This chart was scribed for Danny Camel, MD by [name], ED Scribe. The patient was seen in [facility] and the patient's care was started at 9:12 PM.   Chief Complaint  Patient presents with  . Dizziness   HPI  HPI Comments: Danny Schroeder is a 44 y.o. male with a history of HLD, HTN, DM who presents to the Emergency Department complaining of dizziness. He has cold sweats, nausea, and vomiting as associated symptoms. Pt has had 3 episodes over the past 3 weeks. The first was while [place] was [place]. The second episode was yesterday before going to bed. This morning he woke up fine. He had one more episode after waking up from a nap around 6:00 PM. He got up from the couch and began feeling dizzy which he describes as room spinning. He then broke out in a cold sweat and developed nausea. Pt then had one episode of vomiting. Pt states right now both of his legs are weak, and he has a dry mouth. Pt report movement and sudden turns of his head worsens the dizziness. Being completley still temporarily remedies his symptoms. He checked his BP at home and it was 135/70, he also checked his blood sugar which was 135 and this is low for him. His daughter has the flu. Pt states he has a FHx of heart disease. He denies HA, CP, SOB, neck pain, neck stiffness, and abdominal pain.   Past Medical History  Diagnosis Date  . Hyperlipidemia   . Hypertension   . Diabetes mellitus   . GERD (gastroesophageal reflux disease)    Past Surgical History  Procedure Laterality Date  . Knee surgery  2005    right   Family History  Problem Relation Age of Onset  . Heart disease Mother   . Diabetes Mother   . Hypertension Mother   . Heart disease Father 31    CABG  . Diabetes Sister   . Hypertension Sister    History  Substance Use Topics  . Smoking status: Former Smoker    Quit date: 03/06/1993  . Smokeless tobacco: Never  Used  . Alcohol Use: Yes     Comment: rarely    Review of Systems  Constitutional: Positive for diaphoresis.  HENT: Negative for ear pain and tinnitus.   Eyes: Negative for visual disturbance.  Respiratory: Negative for shortness of breath.   Cardiovascular: Negative for chest pain.  Gastrointestinal: Positive for nausea and vomiting. Negative for abdominal pain.  Musculoskeletal: Negative for neck pain and neck stiffness.  Neurological: Positive for dizziness. Negative for weakness, numbness and headaches.  All other systems reviewed and are negative.  Allergies  Phentermine  Home Medications   Prior to Admission medications   Medication Sig Start Date End Date Taking? Authorizing Provider  aspirin EC 81 MG tablet Take 1 tablet (81 mg total) by mouth daily. 03/15/13  Yes Monica Becton, MD  Dapagliflozin-Metformin HCl ER (XIGDUO XR) 08-999 MG TB24 Take 1 tablet by mouth daily. 12/23/14  Yes Monica Becton, MD  gabapentin (NEURONTIN) 300 MG capsule 2 tabs in the morning, 1 tablet at lunch, 2 tabs at bedtime 01/28/15  Yes Monica Becton, MD  lisinopril-hydrochlorothiazide (PRINZIDE,ZESTORETIC) 20-25 MG per tablet TAKE ONE TABLET BY MOUTH ONE TIME DAILY  10/17/14  Yes Monica Becton, MD  omeprazole (PRILOSEC) 20 MG capsule Take 1 capsule (  20 mg total) by mouth daily. 11/12/14  Yes Monica Bectonhomas J Thekkekandam, MD  simvastatin (ZOCOR) 20 MG tablet TAKE ONE TABLET BY MOUTH NIGHTLY AT BEDTIME  10/17/14  Yes Monica Bectonhomas J Thekkekandam, MD  clotrimazole-betamethasone (LOTRISONE) cream Apply 1 application topically 2 (two) times daily. 07/24/14   Monica Bectonhomas J Thekkekandam, MD  fluticasone (FLONASE) 50 MCG/ACT nasal spray One spray in each nostril twice a day, use left hand for right nostril, and right hand for left nostril. 09/17/14   Monica Bectonhomas J Thekkekandam, MD  meloxicam (MOBIC) 15 MG tablet Take 1 tablet (15 mg total) by mouth daily. Take with food each evening 11/24/14   Lattie HawStephen A  Beese, MD  metFORMIN (GLUCOPHAGE) 1000 MG tablet TAKE ONE AND ONE-HALF TABLETS BY MOUTH TWICE DAILY  09/08/14   Monica Bectonhomas J Thekkekandam, MD  nitroGLYCERIN (NITRODUR - DOSED IN MG/24 HR) 0.2 mg/hr patch Cut and apply 1/4 patch to most painful area q24h. 10/28/14   Monica Bectonhomas J Thekkekandam, MD   BP 130/75 mmHg  Pulse 57  Temp(Src) 97.6 F (36.4 C) (Oral)  Resp 18  Ht 6' (1.829 m)  Wt 200 lb (90.719 kg)  BMI 27.12 kg/m2  SpO2 100% Physical Exam  Constitutional: He is oriented to person, place, and time. He appears well-developed and well-nourished. No distress.  HENT:  Head: Normocephalic and atraumatic.  Right Ear: External ear normal.  Left Ear: External ear normal.  Nose: Nose normal.  Mouth/Throat: Oropharynx is clear and moist.  Eyes: Conjunctivae and EOM are normal. Pupils are equal, round, and reactive to light.  Subtle nystagmus  Neck: Normal range of motion. Neck supple.  No meningismus  Cardiovascular: Normal rate, regular rhythm and normal heart sounds.   Pulmonary/Chest: Effort normal and breath sounds normal. No respiratory distress.  Abdominal: Soft. He exhibits no distension. There is no tenderness.  Musculoskeletal: Normal range of motion.  Neurological: He is alert and oriented to person, place, and time.  Cranial nerves 2-12 grossly intact. 5/5 strength in all four extremities. Normal finger to nose test.  Skin: Skin is warm and dry.  Psychiatric: He has a normal mood and affect. His behavior is normal.  Nursing note and vitals reviewed.   ED Course  Procedures  DIAGNOSTIC STUDIES: Oxygen Saturation is 100% on room air, normal by my interpretation.    COORDINATION OF CARE: 9:22 PM Discussed treatment plan with pt at bedside and pt agreed to plan.  Labs Review Labs Reviewed  BASIC METABOLIC PANEL - Abnormal; Notable for the following:    Glucose, Bld 158 (*)    Anion gap 3 (*)    All other components within normal limits  CBC WITH DIFFERENTIAL/PLATELET -  Abnormal; Notable for the following:    WBC 11.3 (*)    Neutrophils Relative % 79 (*)    Neutro Abs 9.0 (*)    All other components within normal limits  CBG MONITORING, ED - Abnormal; Notable for the following:    Glucose-Capillary 144 (*)    All other components within normal limits   Imaging Review No results found.   EKG Interpretation   Date/Time:  Wednesday January 29 2015 21:12:38 EST Ventricular Rate:  59 PR Interval:  150 QRS Duration: 82 QT Interval:  436 QTC Calculation: 431 R Axis:   39 Text Interpretation:  Sinus bradycardia with sinus arrhythmia Otherwise  normal ECG No old tracing to compare Reconfirmed by Natalyah Cummiskey  MD, Marnell Mcdaniel  ([id number]) on 02/13/2015 7:00:05 AM      MDM  Final diagnoses:  Vertigo    Patient's symptoms are consistent with peripheral vertigo. Given his history it is most likely BPPV. Symptoms resolved with antivert and he can now get up and walked to the bathroom with a normal gait. Normal neuro exam. I highly doubt CVA as cause of his symptoms and do not feel imaging is indicated currently. Will d/c with Rx for antivert and instructions for epley maneuver. Discussed strict return precautions.   I personally performed the services described in this documentation, which was scribed in my presence. The recorded information has been reviewed and is accurate.      Danny Camel, MD 01/29/15 1610  Pricilla Loveless, MD 02/13/15 0700

## 2015-01-30 ENCOUNTER — Encounter: Payer: Self-pay | Admitting: Sports Medicine

## 2015-01-30 ENCOUNTER — Ambulatory Visit (INDEPENDENT_AMBULATORY_CARE_PROVIDER_SITE_OTHER): Payer: BLUE CROSS/BLUE SHIELD | Admitting: Sports Medicine

## 2015-01-30 VITALS — BP 123/86 | HR 72 | Ht 72.0 in | Wt 227.0 lb

## 2015-01-30 DIAGNOSIS — H8113 Benign paroxysmal vertigo, bilateral: Secondary | ICD-10-CM | POA: Diagnosis not present

## 2015-01-30 DIAGNOSIS — H8110 Benign paroxysmal vertigo, unspecified ear: Secondary | ICD-10-CM | POA: Insufficient documentation

## 2015-01-30 MED ORDER — DIAZEPAM 5 MG PO TABS: 5.0000 mg | ORAL_TABLET | Freq: Three times a day (TID) | ORAL | Status: DC | PRN

## 2015-01-30 MED ORDER — PREDNISONE 50 MG PO TABS: 50.0000 mg | ORAL_TABLET | Freq: Every day | ORAL | Status: DC

## 2015-01-30 MED ORDER — MECLIZINE HCL 25 MG PO TABS: 12.5000 mg | ORAL_TABLET | Freq: Every day | ORAL | Status: DC

## 2015-01-30 NOTE — Patient Instructions (Signed)
Benign Positional Vertigo Vertigo means you feel like you or your surroundings are moving when they are not. Benign positional vertigo is the most common form of vertigo. Benign means that the cause of your condition is not serious. Benign positional vertigo is more common in older adults. CAUSES  Benign positional vertigo is the result of an upset in the labyrinth system. This is an area in the middle ear that helps control your balance. This may be caused by a viral infection, head injury, or repetitive motion. However, often no specific cause is found. SYMPTOMS  Symptoms of benign positional vertigo occur when you move your head or eyes in different directions. Some of the symptoms may include:  Loss of balance and falls.  Vomiting.  Blurred vision.  Dizziness.  Nausea.  Involuntary eye movements (nystagmus). DIAGNOSIS  Benign positional vertigo is usually diagnosed by physical exam. If the specific cause of your benign positional vertigo is unknown, your caregiver may perform imaging tests, such as magnetic resonance imaging (MRI) or computed tomography (CT). TREATMENT  Your caregiver may recommend movements or procedures to correct the benign positional vertigo. Medicines such as meclizine, benzodiazepines, and medicines for nausea may be used to treat your symptoms. In rare cases, if your symptoms are caused by certain conditions that affect the inner ear, you may need surgery. HOME CARE INSTRUCTIONS   Follow your caregiver's instructions.  Move slowly. Do not make sudden body or head movements.  Avoid driving.  Avoid operating heavy machinery.  Avoid performing any tasks that would be dangerous to you or others during a vertigo episode.  Drink enough fluids to keep your urine clear or pale yellow. SEEK IMMEDIATE MEDICAL CARE IF:   You develop problems with walking, weakness, numbness, or using your arms, hands, or legs.  You have difficulty speaking.  You develop  severe headaches.  Your nausea or vomiting continues or gets worse.  You develop visual changes.  Your family or friends notice any behavioral changes.  Your condition gets worse.  You have a fever.  You develop a stiff neck or sensitivity to light. MAKE SURE YOU:   Understand these instructions.  Will watch your condition.  Will get help right away if you are not doing well or get worse. Document Released: 08/23/2006 Document Revised: 02/07/2012 Document Reviewed: 08/05/2011 [employer] Patient Information 2015 [employer]. This information is not intended to replace advice given to you by your health care provider. Make sure you discuss any questions you have with your health care provider.    

## 2015-01-30 NOTE — Progress Notes (Signed)
  Subjective:    CC:  vertigo  HPI:  Patient presents with complaint of 3 episodes of horizontal vertigo. He reports that the episodes last several minutes and are provoked by changes in position. With these episodes he becomes nauseated and vomits. He presented to the ED yesterday and was prescribed meclizine, however he is unable to tolerate meclizine because it makes him groggy, which is not safe in his line of work.  He denies feeling presyncopal. He had a negative cardiac workup and normal EKG in the ED. He denies any sensation of ear fullness/pressure, tinnitus, hearing loss. He denies any recent viral infections. He has a strong family history of vertigo.  Past medical history, Surgical history, Family history not pertinant except as noted below, Social history, Allergies, and medications have been entered into the medical record, reviewed, and no changes needed.   Review of Systems: No fevers, chills, night sweats, weight loss, chest pain, or shortness of breath.   Objective:    General: Well Developed, well nourished, and in no acute distress.  Neuro: Alert and oriented x3, extra-ocular muscles intact, sensation grossly intact. Dix-Hallpike maneuver negative. HEENT: Normocephalic, atraumatic, pupils equal round reactive to light, neck supple, no masses, no lymphadenopathy, thyroid nonpalpable. EACs without erythema, and with minimal cerumen. TMs clear bilaterally. Skin: Warm and dry, no rashes. Cardiac: Regular rate and rhythm, no murmurs rubs or gallops, no lower extremity edema.  Respiratory: Clear to auscultation bilaterally. Not using accessory muscles, speaking in full sentences.   Impression and Recommendations:     # Vertigo - Consider BPPV most likely diagnosis as vertiginous episodes triggered by change in position. In this case Meniere's disease is unlikely due to lack of auditory symptoms, vestibular neuritis/ labyrinthitis is also is less likely in the absence of  recent infection or gait instability. Physical exam not consistent with otitis media. - Patient to continue taking meclizine, but cut his dose by half to 12.5 mg daily - Patient may also try diazepam 5 mg instead of meclizine in hopes that it will be less sedating. - Begin prednisone 50 mg and taper over 5 days - Patient referred to vestibular rehab to practice Epley maneuver   Follow up in 2 weeks or sooner if needed  I spent 40 minutes with this patient, greater than 50% was face-to-face time counseling regarding the above diagnosis

## 2015-01-30 NOTE — Assessment & Plan Note (Signed)
Cut back to one half of a meclizine, adding Valium, and his own, and referral to vestibular rehabilitation. Return in 2 weeks.

## 2015-02-04 ENCOUNTER — Ambulatory Visit: Payer: BLUE CROSS/BLUE SHIELD | Admitting: Sports Medicine

## 2015-02-13 ENCOUNTER — Ambulatory Visit: Payer: BLUE CROSS/BLUE SHIELD | Admitting: Sports Medicine

## 2015-02-17 ENCOUNTER — Encounter: Payer: Self-pay | Admitting: Sports Medicine

## 2015-02-17 ENCOUNTER — Ambulatory Visit (INDEPENDENT_AMBULATORY_CARE_PROVIDER_SITE_OTHER): Payer: BLUE CROSS/BLUE SHIELD | Admitting: Sports Medicine

## 2015-02-17 VITALS — BP 121/83 | HR 97 | Ht 72.0 in | Wt 224.0 lb

## 2015-02-17 DIAGNOSIS — G5762 Lesion of plantar nerve, left lower limb: Secondary | ICD-10-CM | POA: Diagnosis not present

## 2015-02-17 DIAGNOSIS — E1142 Type 2 diabetes mellitus with diabetic polyneuropathy: Secondary | ICD-10-CM | POA: Diagnosis not present

## 2015-02-17 DIAGNOSIS — H8113 Benign paroxysmal vertigo, bilateral: Secondary | ICD-10-CM | POA: Diagnosis not present

## 2015-02-17 NOTE — Assessment & Plan Note (Signed)
Completely resolved with vestibular rehabilitation and occasional meclizine. Has not taken the Valium.

## 2015-02-17 NOTE — Progress Notes (Addendum)
  Subjective:    CC: Follow-up  HPI: Vertigo: Now resolved with vestibular rehabilitation, occasional meclizine, has not had to use his family.  Diabetes mellitus type 2: Complete resolution of right-sided peripheral neuropathy with gabapentin at a much lower dose than he is taking now.  Left foot numbness: Localized on the plantar aspect, with pain between the third and fourth metatarsal heads, we have been ramping up his gabapentin the hopes of blocking this but unfortunately he has not noted any relief. Orthotics helped somewhat, and he doesn't have many symptoms when in his work boots, however out of the boots he has significant pain between the third and fourth metatarsal heads with a sensation of numbness on the plantar aspect.  Past medical history, Surgical history, Family history not pertinant except as noted below, Social history, Allergies, and medications have been entered into the medical record, reviewed, and no changes needed.   Review of Systems: No fevers, chills, night sweats, weight loss, chest pain, or shortness of breath.   Objective:    General: Well Developed, well nourished, and in no acute distress.  Neuro: Alert and oriented x3, extra-ocular muscles intact, sensation grossly intact.  HEENT: Normocephalic, atraumatic, pupils equal round reactive to light, neck supple, no masses, no lymphadenopathy, thyroid nonpalpable.  Skin: Warm and dry, no rashes. Cardiac: Regular rate and rhythm, no murmurs rubs or gallops, no lower extremity edema.  Respiratory: Clear to auscultation bilaterally. Not using accessory muscles, speaking in full sentences. Left Foot: No visible erythema or swelling. Range of motion is full in all directions. Strength is 5/5 in all directions. No hallux valgus. No pes cavus or pes planus. No abnormal callus noted. No pain over the navicular prominence, or base of fifth metatarsal. No tenderness to palpation of the calcaneal insertion of plantar  fascia. No pain at the Achilles insertion. No pain over the calcaneal bursa. No pain of the retrocalcaneal bursa. Tender to palpation with compression of the third and fourth metatarsal heads. No hallux rigidus or limitus. No tenderness palpation over interphalangeal joints. Neurovascularly intact distally.  Procedure: Real-time Ultrasound Guided Injection/peripheral nerve block of left 3/4 intermetatarsal neuroma, bifurcation of the common digital nerve Device: GE Logiq E  Verbal informed consent obtained.  Time-out conducted.  Noted no overlying erythema, induration, or other signs of local infection.  Skin prepped in a sterile fashion.  Local anesthesia: Topical Ethyl chloride.  With sterile technique and under real time ultrasound guidance:  Noted visible hypoechoic structure between the third and fourth metatarsal heads suggestive of a Morton's neuroma, 1 mL kenalog 40, 1 mL lidocaine injected around the structure. Completed without difficulty  Pain immediately resolved suggesting accurate placement of the medication.  Advised to call if fevers/chills, erythema, induration, drainage, or persistent bleeding.  Images permanently stored and available for review in the ultrasound unit.  Impression: Technically successful ultrasound guided injection.  Impression and Recommendations:    I spent 40 minutes with this patient, greater than 50% was face-to-face time counseling regarding the multiple above diagnoses.

## 2015-02-17 NOTE — Assessment & Plan Note (Signed)
Good response to peripheral neuropathy of the right foot. Gabapentin was effective at approximately 2-3 pills per day.

## 2015-02-17 NOTE — Assessment & Plan Note (Signed)
Persistent symptoms without a response to gabapentin, custom orthotics with metatarsal pads. Pain is between the third and fourth injection as above. Return in a month, referral for neuroma excision if no better.

## 2015-03-31 ENCOUNTER — Ambulatory Visit: Payer: BLUE CROSS/BLUE SHIELD | Admitting: Sports Medicine

## 2015-04-02 ENCOUNTER — Encounter: Payer: Self-pay | Admitting: Sports Medicine

## 2015-04-02 ENCOUNTER — Ambulatory Visit (INDEPENDENT_AMBULATORY_CARE_PROVIDER_SITE_OTHER): Payer: BLUE CROSS/BLUE SHIELD | Admitting: Sports Medicine

## 2015-04-02 VITALS — BP 121/78 | HR 87 | Temp 97.4°F | Resp 18 | Ht 72.0 in | Wt 199.0 lb

## 2015-04-02 DIAGNOSIS — I10 Essential (primary) hypertension: Secondary | ICD-10-CM | POA: Diagnosis not present

## 2015-04-02 DIAGNOSIS — E1142 Type 2 diabetes mellitus with diabetic polyneuropathy: Secondary | ICD-10-CM

## 2015-04-02 DIAGNOSIS — G5762 Lesion of plantar nerve, left lower limb: Secondary | ICD-10-CM

## 2015-04-02 LAB — POCT GLYCOSYLATED HEMOGLOBIN (HGB A1C): Hemoglobin A1C: 7.4

## 2015-04-02 NOTE — Assessment & Plan Note (Signed)
Well-controlled, A1c is 7.4. This is 2 months out, probably even lower by the next month. Return to see me in 6 months, no changes. Neuropathy has resolved, he is even off of gabapentin now.

## 2015-04-02 NOTE — Assessment & Plan Note (Signed)
Well controlled, no changes 

## 2015-04-02 NOTE — Assessment & Plan Note (Signed)
Symptoms completely resolved after ultrasound-guided 2/3 intermetatarsal bursa.

## 2015-04-02 NOTE — Progress Notes (Signed)
  Subjective:    CC:  Follow-up  HPI:  diabetes mellitus type 2: A1c has improved.   Peripheral neuropathy: Resolved, has since discontinued gabapentin.   Morton's neuroma: Resolved after injection at the last visit.  Past medical history, Surgical history, Family history not pertinant except as noted below, Social history, Allergies, and medications have been entered into the medical record, reviewed, and no changes needed.   Review of Systems: No fevers, chills, night sweats, weight loss, chest pain, or shortness of breath.   Objective:    General: Well Developed, well nourished, and in no acute distress.  Neuro: Alert and oriented x3, extra-ocular muscles intact, sensation grossly intact.  HEENT: Normocephalic, atraumatic, pupils equal round reactive to light, neck supple, no masses, no lymphadenopathy, thyroid nonpalpable.  Skin: Warm and dry, no rashes. Cardiac: Regular rate and rhythm, no murmurs rubs or gallops, no lower extremity edema.  Respiratory: Clear to auscultation bilaterally. Not using accessory muscles, speaking in full sentences.  Impression and Recommendations:

## 2015-05-04 ENCOUNTER — Emergency Department
Admission: EM | Admit: 2015-05-04 | Discharge: 2015-05-04 | Disposition: A | Discharge status: Home / Self Care | Payer: BLUE CROSS/BLUE SHIELD | Source: Home / Self Care | Attending: Emergency Medicine | Admitting: Emergency Medicine

## 2015-05-04 ENCOUNTER — Encounter: Payer: Self-pay | Admitting: Emergency Medicine

## 2015-05-04 DIAGNOSIS — S80811A Abrasion, right lower leg, initial encounter: Secondary | ICD-10-CM | POA: Diagnosis not present

## 2015-05-04 MED ORDER — HYDROCODONE-ACETAMINOPHEN 5-325 MG PO TABS: 2.0000 | ORAL_TABLET | ORAL | Status: DC | PRN

## 2015-05-04 NOTE — Discharge Instructions (Signed)

## 2015-05-04 NOTE — ED Notes (Signed)
Patient ran into dog crate yesterday and fell twisting right lower leg; has lacerations from crate which he cleaned and used topical antibacterial ointment on. Tetanus immunization within last 5 years.

## 2015-05-05 NOTE — ED Provider Notes (Signed)
CSN: [id number]     Arrival date & time 05/04/15  1144 History   First MD Initiated Contact with Patient 05/04/15 1301     Chief Complaint  Patient presents with  . Leg Injury     (Consider location/radiation/quality/duration/timing/severity/associated sxs/prior Treatment) Patient is a 44 y.o. male presenting with leg pain. The history is provided by the patient. No language interpreter was used.  Leg Pain Location:  Leg Time since incident:  1 day Injury: yes   Leg location:  R leg Pain details:    Quality:  Aching   Radiates to:  Does not radiate   Severity:  Moderate   Onset quality:  Gradual   Duration:  1 day   Timing:  Constant Chronicity:  New Foreign body present:  No foreign bodies Tetanus status:  Up to date Relieved by:  Nothing Worsened by:  Nothing tried Ineffective treatments:  None tried Pt complains of abrasions to lower right leg,    Past Medical History  Diagnosis Date  . Hyperlipidemia   . Hypertension   . Diabetes mellitus   . GERD (gastroesophageal reflux disease)    Past Surgical History  Procedure Laterality Date  . Knee surgery  2005    right   Family History  Problem Relation Age of Onset  . Heart disease Mother   . Diabetes Mother   . Hypertension Mother   . Heart disease Father 26    CABG  . Diabetes Sister   . Hypertension Sister    History  Substance Use Topics  . Smoking status: Former Smoker    Quit date: 03/06/1993  . Smokeless tobacco: Never Used  . Alcohol Use: Yes     Comment: rarely    Review of Systems  All other systems reviewed and are negative.     Allergies  Phentermine  Home Medications   Prior to Admission medications   Medication Sig Start Date End Date Taking? Authorizing Provider  aspirin EC 81 MG tablet Take 1 tablet (81 mg total) by mouth daily. 03/15/13   Monica Becton, MD  clotrimazole-betamethasone (LOTRISONE) cream Apply 1 application topically 2 (two) times daily. 07/24/14   Monica Becton, MD  Dapagliflozin-Metformin HCl ER (XIGDUO XR) 08-999 MG TB24 Take 1 tablet by mouth daily. 12/23/14   Monica Becton, MD  diazepam (VALIUM) 5 MG tablet Take 1 tablet (5 mg total) by mouth every 8 (eight) hours as needed for anxiety (vertigo/dizziness). 01/30/15   Monica Becton, MD  fluticasone (FLONASE) 50 MCG/ACT nasal spray One spray in each nostril twice a day, use left hand for right nostril, and right hand for left nostril. 09/17/14   Monica Becton, MD  HYDROcodone-acetaminophen (NORCO/VICODIN) 5-325 MG per tablet Take 2 tablets by mouth every 4 (four) hours as needed. 05/04/15   Elson Areas, PA-C  lisinopril-hydrochlorothiazide (PRINZIDE,ZESTORETIC) 20-25 MG per tablet TAKE ONE TABLET BY MOUTH ONE TIME DAILY  10/17/14   Monica Becton, MD  meclizine (ANTIVERT) 25 MG tablet Take 0.5 tablets (12.5 mg total) by mouth daily. 01/30/15   Monica Becton, MD  meloxicam (MOBIC) 15 MG tablet Take 1 tablet (15 mg total) by mouth daily. Take with food each evening 11/24/14   Lattie Haw, MD  metFORMIN (GLUCOPHAGE) 1000 MG tablet TAKE ONE AND ONE-HALF TABLETS BY MOUTH TWICE DAILY  09/08/14   Monica Becton, MD  nitroGLYCERIN (NITRODUR - DOSED IN MG/24 HR) 0.2 mg/hr patch Cut and apply 1/4 patch  to most painful area q24h. 10/28/14   Monica Bectonhomas J Thekkekandam, MD  omeprazole (PRILOSEC) 20 MG capsule Take 1 capsule (20 mg total) by mouth daily. 11/12/14   Monica Bectonhomas J Thekkekandam, MD  simvastatin (ZOCOR) 20 MG tablet TAKE ONE TABLET BY MOUTH NIGHTLY AT BEDTIME  10/17/14   Monica Bectonhomas J Thekkekandam, MD   BP 109/69 mmHg  Pulse 90  Temp(Src) 98 F (36.7 C) (Oral)  Resp 18  Ht 6' (1.829 m)  Wt 198 lb (89.812 kg)  BMI 26.85 kg/m2  SpO2 98% Physical Exam  Constitutional: He is oriented to person, place, and time. He appears well-developed and well-nourished.  HENT:  Head: Normocephalic.  Eyes: EOM are normal.  Neck: Normal range of motion.  Cardiovascular:  Normal rate.   Pulmonary/Chest: Effort normal.  Abdominal: He exhibits no distension.  Musculoskeletal: He exhibits tenderness.  Multiple superficial abrasions.  Neurological: He is alert and oriented to person, place, and time.  Skin: There is erythema.  Psychiatric: He has a normal mood and affect.  Nursing note and vitals reviewed.   ED Course  Procedures (including critical care time) Labs Review Labs Reviewed - No data to display  Imaging Review No results found.   EKG Interpretation None      MDM   Final diagnoses:  Abrasion of right leg, initial encounter    avs Pt counseled in wound care and need for follow up.   Lonia SkinnerLeslie K Shawnee HillsSofia, PA-C 05/05/15 [phone]  Donnetta HutchingBrian Cook, MD 05/05/15 [phone]

## 2015-05-08 ENCOUNTER — Encounter: Payer: Self-pay | Admitting: Family Medicine

## 2015-05-08 ENCOUNTER — Ambulatory Visit (INDEPENDENT_AMBULATORY_CARE_PROVIDER_SITE_OTHER): Payer: BLUE CROSS/BLUE SHIELD | Admitting: Family Medicine

## 2015-05-08 VITALS — BP 141/91 | HR 81 | Wt 201.0 lb

## 2015-05-08 DIAGNOSIS — S80811D Abrasion, right lower leg, subsequent encounter: Secondary | ICD-10-CM

## 2015-05-08 NOTE — Progress Notes (Signed)
   Subjective:    Patient ID: Danny Schroeder, male    DOB: Aug 21, 1971, 43 y.o.   MRN: [id number]  HPI Patient fell onto dog crate and was seen at [facility] on 05/04/15.  He had multiple abrasion and pain in the right leg. He wanted to come in for the weekends to get the leg rechecked. He still having some pain. He has been written out of work until Monday. He's been applying Neosporin and then wrapping with an Ace wrap. Is not having any fevers chills or sweats. No streaking or erythema. No drainage from the wound.   Review of Systems     Objective:   Physical Exam  Constitutional: He appears well-developed and well-nourished.  Skin: Skin is warm.  On the right anterior shin he has multiple abrasions. The wounds look leaning dry and intact. No drainage. No streaking erythema. Somes mild induration around the wounds themselves. And some bruising just above the ankle.  Psychiatric: He has a normal mood and affect. His behavior is normal.          Assessment & Plan:  Abrasion, right lower leg-healing well. He's done a fantastic job and keeping the wound clean. Recommend discontinue Neosporin and switch to petroleum jelly. Continue to keep a dressing on it through the weekend. Early next week he may be able to just switch to some gauze with a sock. Monitor carefully for any sign of infection and call us and let us know if he sees anything since concerned about. I think he still going to be okay to return to work on Monday. Continue to keep elevated when he is able. Can use IBU present as needed for pain relief.

## 2015-08-19 ENCOUNTER — Ambulatory Visit (INDEPENDENT_AMBULATORY_CARE_PROVIDER_SITE_OTHER): Payer: BLUE CROSS/BLUE SHIELD | Admitting: Sports Medicine

## 2015-08-19 ENCOUNTER — Encounter: Payer: Self-pay | Admitting: Sports Medicine

## 2015-08-19 VITALS — BP 115/73 | HR 67 | Wt 199.0 lb

## 2015-08-19 DIAGNOSIS — G5762 Lesion of plantar nerve, left lower limb: Secondary | ICD-10-CM

## 2015-08-19 DIAGNOSIS — E1142 Type 2 diabetes mellitus with diabetic polyneuropathy: Secondary | ICD-10-CM

## 2015-08-19 DIAGNOSIS — Z23 Encounter for immunization: Secondary | ICD-10-CM

## 2015-08-19 DIAGNOSIS — H8113 Benign paroxysmal vertigo, bilateral: Secondary | ICD-10-CM | POA: Diagnosis not present

## 2015-08-19 DIAGNOSIS — E785 Hyperlipidemia, unspecified: Secondary | ICD-10-CM

## 2015-08-19 DIAGNOSIS — Z3009 Encounter for other general counseling and advice on contraception: Secondary | ICD-10-CM | POA: Insufficient documentation

## 2015-08-19 LAB — POCT GLYCOSYLATED HEMOGLOBIN (HGB A1C): Hemoglobin A1C: 7.6

## 2015-08-19 MED ORDER — SCOPOLAMINE 1 MG/3DAYS TD PT72: 1.0000 | MEDICATED_PATCH | TRANSDERMAL | Status: DC

## 2015-08-19 NOTE — Assessment & Plan Note (Signed)
Referral for vasectomy 

## 2015-08-19 NOTE — Assessment & Plan Note (Signed)
Rechecking lipids. 

## 2015-08-19 NOTE — Assessment & Plan Note (Signed)
Going on a cruise, adding scopolamine patches.

## 2015-08-19 NOTE — Progress Notes (Signed)
  Subjective:    CC: Follow-up  HPI: Diabetes mellitus type 2: Eye exam was last month, due for an A1c today. Compliant with all of his diabetes medications.  Hyperlipidemia: Due for recheck  Morton's neuroma: Persistent numbness in the 2/3 and 3/4 interspace despite custom orthotics, metatarsal pad and intermetatarsal injection. Intermetatarsal injection provided 2 months of response.  Family planning: Needs a referral for vasectomy  Motion sickness: Going on a cruise with his wife, desires scopolamine patches.  Past medical history, Surgical history, Family history not pertinant except as noted below, Social history, Allergies, and medications have been entered into the medical record, reviewed, and no changes needed.   Review of Systems: No fevers, chills, night sweats, weight loss, chest pain, or shortness of breath.   Objective:    General: Well Developed, well nourished, and in no acute distress.  Neuro: Alert and oriented x3, extra-ocular muscles intact, sensation grossly intact.  HEENT: Normocephalic, atraumatic, pupils equal round reactive to light, neck supple, no masses, no lymphadenopathy, thyroid nonpalpable.  Skin: Warm and dry, no rashes. Cardiac: Regular rate and rhythm, no murmurs rubs or gallops, no lower extremity edema.  Respiratory: Clear to auscultation bilaterally. Not using accessory muscles, speaking in full sentences.  Diabetic Foot Exam Both feet were examined, there are no signs of ulceration or abnormal callus. Nails are unremarkable. Dorsalis pedis and posterior tibial pulses are palpable. Sensation is intact to sharp and monofilament. Shoes are of appropriate fitment.  Impression and Recommendations:    I spent 40 minutes with this patient, greater than 50% was face-to-face time counseling regarding the above diagnoses

## 2015-08-19 NOTE — Assessment & Plan Note (Signed)
MRI, does have numbness in the 2/3 and a 3/4 interspaces. This has been persistent for months despite custom orthotics, metatarsal pads and injections.

## 2015-08-19 NOTE — Assessment & Plan Note (Signed)
Well controlled, no changes 

## 2015-09-10 ENCOUNTER — Emergency Department
Admission: EM | Admit: 2015-09-10 | Discharge: 2015-09-10 | Disposition: A | Discharge status: Home / Self Care | Payer: BLUE CROSS/BLUE SHIELD | Source: Home / Self Care | Attending: Family Medicine | Admitting: Family Medicine

## 2015-09-10 ENCOUNTER — Encounter: Payer: Self-pay | Admitting: *Deleted

## 2015-09-10 DIAGNOSIS — J029 Acute pharyngitis, unspecified: Secondary | ICD-10-CM

## 2015-09-10 LAB — POCT RAPID STREP A (OFFICE): Rapid Strep A Screen: NEGATIVE

## 2015-09-10 MED ORDER — BENZONATATE 100 MG PO CAPS: 100.0000 mg | ORAL_CAPSULE | Freq: Three times a day (TID) | ORAL | Status: DC

## 2015-09-10 MED ORDER — IBUPROFEN 600 MG PO TABS: 600.0000 mg | ORAL_TABLET | Freq: Four times a day (QID) | ORAL | Status: DC | PRN

## 2015-09-10 NOTE — ED Provider Notes (Signed)
CSN: [id number]     Arrival date & time 09/10/15  1834 History   First MD Initiated Contact with Patient 09/10/15 1919     Chief Complaint  Patient presents with  . Sore Throat   (Consider location/radiation/quality/duration/timing/severity/associated sxs/prior Treatment) HPI  [facility] 44[facility]/o gradually worsening sore throat that started 2 days ago. Pain is aching and sore, mild to moderate in severity.  Pain improves with ibuprofen and drinking liquids. Reports mild nasal congestion but denies cough, fever, chills, n/v/d. Denies recent travel or sick contacts.   Past Medical History  Diagnosis Date  . Hyperlipidemia   . Hypertension   . Diabetes mellitus   . GERD (gastroesophageal reflux disease)    Past Surgical History  Procedure Laterality Date  . Knee surgery  2005    right   Family History  Problem Relation Age of Onset  . Heart disease Mother   . Diabetes Mother   . Hypertension Mother   . Heart disease Father [id number]    CABG  . Diabetes Sister   . Hypertension Sister    Social History  Substance Use Topics  . Smoking status: Former Smoker    Quit date: 03/06/1993  . Smokeless tobacco: Never Used  . Alcohol Use: Yes     Comment: rarely    Review of Systems  Constitutional: Negative for fever and chills.  HENT: Positive for congestion and sore throat. Negative for ear pain, trouble swallowing and voice change.   Respiratory: Negative for cough and shortness of breath.   Cardiovascular: Negative for chest pain and palpitations.  Gastrointestinal: Negative for nausea, vomiting, abdominal pain and diarrhea.  Musculoskeletal: Negative for myalgias, back pain and arthralgias.  Skin: Negative for rash.  All other systems reviewed and are negative.   Allergies  Phentermine  Home Medications   Prior to Admission medications   Medication Sig Start Date End Date Taking? Authorizing Provider  aspirin EC 81 MG tablet Take 1 tablet (81 mg  total) by mouth daily. 03/15/13   Monica Bectonhomas J Thekkekandam, MD  benzonatate (TESSALON) 100 MG capsule Take 1 capsule (100 mg total) by mouth every 8 (eight) hours. 09/10/15   Junius FinnerErin O'Malley, PA-C  Dapagliflozin-Metformin HCl ER (XIGDUO XR) 08-999 MG TB24 Take 1 tablet by mouth daily. 12/23/14   Monica Bectonhomas J Thekkekandam, MD  diazepam (VALIUM) 5 MG tablet Take 1 tablet (5 mg total) by mouth every 8 (eight) hours as needed for anxiety (vertigo/dizziness). 01/30/15   Monica Bectonhomas J Thekkekandam, MD  fluticasone (FLONASE) 50 MCG/ACT nasal spray One spray in each nostril twice a day, use left hand for right nostril, and right hand for left nostril. 09/17/14   Monica Bectonhomas J Thekkekandam, MD  HYDROcodone-acetaminophen (NORCO/VICODIN) 5-325 MG per tablet Take 2 tablets by mouth every 4 (four) hours as needed. 05/04/15   Elson AreasLeslie K Sofia, PA-C  ibuprofen (ADVIL,MOTRIN) 600 MG tablet Take 1 tablet (600 mg total) by mouth every 6 (six) hours as needed. 09/10/15   Junius FinnerErin O'Malley, PA-C  lisinopril-hydrochlorothiazide (PRINZIDE,ZESTORETIC) 20-25 MG per tablet TAKE ONE TABLET BY MOUTH ONE TIME DAILY  10/17/14   Monica Bectonhomas J Thekkekandam, MD  meloxicam (MOBIC) 15 MG tablet Take 1 tablet (15 mg total) by mouth daily. Take with food each evening 11/24/14   Lattie HawStephen A Beese, MD  metFORMIN (GLUCOPHAGE) 1000 MG tablet TAKE ONE AND ONE-HALF TABLETS BY MOUTH TWICE DAILY  09/08/14   Monica Bectonhomas J Thekkekandam, MD  omeprazole (PRILOSEC) 20 MG capsule Take 1  capsule (20 mg total) by mouth daily. 11/12/14   Monica Becton, MD  scopolamine (TRANSDERM-SCOP, 1.5 MG,) 1 MG/3DAYS Place 1 patch (1.5 mg total) onto the skin every 3 (three) days. 08/19/15   Monica Becton, MD  simvastatin (ZOCOR) 20 MG tablet TAKE ONE TABLET BY MOUTH NIGHTLY AT BEDTIME  10/17/14   Monica Becton, MD   Meds Ordered and Administered this Visit  Medications - No data to display  BP 122/78 mmHg  Pulse 88  Temp(Src) 98.3 F (36.8 C) (Oral)  Resp 18  Ht  (1.88 m)   Wt 195 lb (88.451 kg)  BMI 25.03 kg/m2  SpO2 97% No data found.   Physical Exam  Constitutional: He appears well-developed and well-nourished.  HENT:  Head: Normocephalic and atraumatic.  Right Ear: Hearing, tympanic membrane, external ear and ear canal normal.  Left Ear: Hearing, tympanic membrane, external ear and ear canal normal.  Nose: Nose normal.  Mouth/Throat: Uvula is midline and mucous membranes are normal. Posterior oropharyngeal erythema present. No oropharyngeal exudate, posterior oropharyngeal edema or tonsillar abscesses.  Eyes: Conjunctivae are normal. No scleral icterus.  Neck: Normal range of motion. Neck supple.  Cardiovascular: Normal rate, regular rhythm and normal heart sounds.   Pulmonary/Chest: Effort normal and breath sounds normal. No respiratory distress. He has no wheezes. He has no rales. He exhibits no tenderness.  Abdominal: Soft. He exhibits no distension. There is no tenderness.  Musculoskeletal: Normal range of motion.  Lymphadenopathy:    He has no cervical adenopathy.  Neurological: He is alert.  Skin: Skin is warm and dry.  Nursing note and vitals reviewed.   ED Course  Procedures (including critical care time)  Labs Review Labs Reviewed  STREP A DNA PROBE  POCT RAPID STREP A (OFFICE)    Imaging Review No results found.    MDM   1. Acute pharyngitis, unspecified etiology    Pt c/o sore throat and nasal congestion. No respiratory distress. No peritonsillar abscess. Rapid strep: negative. Likely viral. Rx: ibuprofen and tessalon  F/u with PCP in 4-5 days if not improving in. Sooner if worsening. Patient verbalized understanding and agreement with treatment plan.    Junius Finner, PA-C 09/10/15 1932

## 2015-09-10 NOTE — Discharge Instructions (Signed)

## 2015-09-10 NOTE — ED Notes (Signed)
Pt c/o sore throat x 2 days. Denies fever. Took IBF at 1630.

## 2015-09-11 ENCOUNTER — Telehealth: Payer: Self-pay | Admitting: Emergency Medicine

## 2015-09-11 LAB — STREP A DNA PROBE: GASP: NEGATIVE

## 2015-10-06 ENCOUNTER — Encounter: Payer: Self-pay | Admitting: Sports Medicine

## 2015-10-06 ENCOUNTER — Ambulatory Visit (INDEPENDENT_AMBULATORY_CARE_PROVIDER_SITE_OTHER): Payer: BLUE CROSS/BLUE SHIELD | Admitting: Sports Medicine

## 2015-10-06 VITALS — BP 122/83 | HR 72 | Temp 98.0°F | Resp 18 | Ht 74.0 in | Wt 195.8 lb

## 2015-10-06 DIAGNOSIS — E1142 Type 2 diabetes mellitus with diabetic polyneuropathy: Secondary | ICD-10-CM

## 2015-10-06 DIAGNOSIS — Z0000 Encounter for general adult medical examination without abnormal findings: Secondary | ICD-10-CM | POA: Diagnosis not present

## 2015-10-06 DIAGNOSIS — I10 Essential (primary) hypertension: Secondary | ICD-10-CM

## 2015-10-06 DIAGNOSIS — E785 Hyperlipidemia, unspecified: Secondary | ICD-10-CM

## 2015-10-06 DIAGNOSIS — G5762 Lesion of plantar nerve, left lower limb: Secondary | ICD-10-CM | POA: Diagnosis not present

## 2015-10-06 DIAGNOSIS — H6122 Impacted cerumen, left ear: Secondary | ICD-10-CM | POA: Diagnosis not present

## 2015-10-06 LAB — LIPID PANEL
Cholesterol: 168 mg/dL (ref 125–200)
HDL: 60 mg/dL (ref >=40)
LDL Cholesterol: 88 mg/dL (ref <130)
Total CHOL/HDL Ratio: 2.8 Ratio (ref <=5.0)
Triglycerides: 101 mg/dL (ref <150)
VLDL: 20 mg/dL (ref <30)

## 2015-10-06 LAB — HEMOGLOBIN A1C
Hgb A1c MFr Bld: 7.9 % — ABNORMAL HIGH (ref <5.7)
Mean Plasma Glucose: 180 mg/dL — ABNORMAL HIGH (ref <117)

## 2015-10-06 NOTE — Progress Notes (Signed)
  Subjective:    CC complete physical exam  HPI:  This is a pleasant 44 year old male, his hypertension is well controlled, diabetes has been well-controlled, and hyperlipidemia has been beautifully controlled. He does have a Morton's neuroma on his left foot that is pain-free with orthotics and metatarsal pads, he still has persistent numbness but it is tolerable. He has no complaints.  Past medical history, Surgical history, Family history not pertinant except as noted below, Social history, Allergies, and medications have been entered into the medical record, reviewed, and no changes needed.   Review of Systems: No headache, visual changes, nausea, vomiting, diarrhea, constipation, dizziness, abdominal pain, skin rash, fevers, chills, night sweats, swollen lymph nodes, weight loss, chest pain, body aches, joint swelling, muscle aches, shortness of breath, mood changes, visual or auditory hallucinations.  Objective:   General: Well Developed, well nourished, and in no acute distress.  Neuro: Alert and oriented x3, extra-ocular muscles intact, sensation grossly intact. Cranial nerves II through XII are intact, motor, sensory, and coordinative functions are all intact. HEENT: Normocephalic, atraumatic, pupils equal round reactive to light, neck supple, no masses, no lymphadenopathy, thyroid nonpalpable. Oropharynx, nasopharynx, external ear canals are unremarkable. There is impacted cerumen in the left ear canal. Skin: Warm and dry, no rashes noted.  Cardiac: Regular rate and rhythm, no murmurs rubs or gallops.  Respiratory: Clear to auscultation bilaterally. Not using accessory muscles, speaking in full sentences.  Abdominal: Soft, nontender, nondistended, positive bowel sounds, no masses, no organomegaly.  Musculoskeletal: Shoulder, elbow, wrist, hip, knee, ankle stable, and with full range of motion.  Indication: Cerumen impaction of the ear(s) Medical necessity statement: On physical  examination, cerumen impairs clinically significant portions of the external auditory canal, and tympanic membrane. Noted obstructive, copious cerumen that cannot be removed without magnification and instrumentations requiring physician skills Consent: Discussed benefits and risks of procedure and verbal consent obtained Procedure: Patient was prepped for the procedure. Utilized an otoscope to assess and take note of the ear canal, the tympanic membrane, and the presence, amount, and placement of the cerumen. Gentle water irrigation and soft plastic curette was utilized to remove cerumen.  Post procedure examination: shows cerumen was completely removed. Patient tolerated procedure well. The patient is made aware that they may experience temporary vertigo, temporary hearing loss, and temporary discomfort. If these symptom last for more than 24 hours to call the clinic or proceed to the ED.  Impression and Recommendations:    The patient was counselled, risk factors were discussed, anticipatory guidance given.

## 2015-10-06 NOTE — Assessment & Plan Note (Signed)
Physical exam as above. 

## 2015-10-06 NOTE — Assessment & Plan Note (Signed)
Stable, continue current medications.  

## 2015-10-06 NOTE — Assessment & Plan Note (Signed)
There is persistent numbness, no pain, no further intervention needed, he does have custom orthotics with metatarsal pads and he didn't respond for a couple of months after injection.

## 2015-10-06 NOTE — Assessment & Plan Note (Signed)
Rechecking lipids and A1c

## 2015-10-14 ENCOUNTER — Other Ambulatory Visit: Payer: Self-pay | Admitting: Sports Medicine

## 2015-12-16 ENCOUNTER — Other Ambulatory Visit: Payer: Self-pay | Admitting: Sports Medicine

## 2016-02-04 ENCOUNTER — Telehealth: Payer: Self-pay

## 2016-02-04 MED ORDER — GABAPENTIN 300 MG PO CAPS: ORAL_CAPSULE | ORAL | Status: DC

## 2016-02-04 NOTE — Telephone Encounter (Signed)
Gabapentin ordered

## 2016-02-04 NOTE — Telephone Encounter (Signed)
Routing to Dr. Corey 

## 2016-02-04 NOTE — Telephone Encounter (Signed)
Patient states he is having burning in left ankle. He believes it is due to neuropathy. He would like a refill on gabapentin. Not on current medication list. He has a follow up tomorrow with Dr Benjamin Stainhekkekandam.

## 2016-02-05 ENCOUNTER — Ambulatory Visit (INDEPENDENT_AMBULATORY_CARE_PROVIDER_SITE_OTHER): Payer: BLUE CROSS/BLUE SHIELD

## 2016-02-05 ENCOUNTER — Ambulatory Visit (INDEPENDENT_AMBULATORY_CARE_PROVIDER_SITE_OTHER): Payer: BLUE CROSS/BLUE SHIELD | Admitting: Sports Medicine

## 2016-02-05 VITALS — BP 107/73 | HR 76 | Resp 18 | Wt 198.7 lb

## 2016-02-05 DIAGNOSIS — E1142 Type 2 diabetes mellitus with diabetic polyneuropathy: Secondary | ICD-10-CM | POA: Diagnosis not present

## 2016-02-05 DIAGNOSIS — M533 Sacrococcygeal disorders, not elsewhere classified: Secondary | ICD-10-CM

## 2016-02-05 MED ORDER — CAPSAICIN 0.15 % EX LIQD: CUTANEOUS | Status: DC

## 2016-02-05 MED ORDER — SAXAGLIPTIN-METFORMIN ER 5-1000 MG PO TB24: 1.0000 | ORAL_TABLET | Freq: Every day | ORAL | Status: DC

## 2016-02-05 NOTE — Assessment & Plan Note (Addendum)
Up-to-date on screening measures, rechecking hemoglobin A1c needs to restart gabapentin. Adding topical capsaicin, which is certainly at amitriptyline if persistent burning sensations even at max dose gabapentin. Considering increase in A1c I am going to add a DPP 4 inhibitor to his regimen. Adding Kombiglyze. His continue extra metformin

## 2016-02-05 NOTE — Assessment & Plan Note (Signed)
X-rays, donut cushion in the police car. Return in one month, injection if no better.

## 2016-02-05 NOTE — Progress Notes (Signed)
  Subjective:    CC: Neuropathy  HPI: Diabetes mellitus type 2: Worsened with A1c increased to 7.9 with some burning sensations on the lateral aspect of his left ankle without any back pain. He has been out of gabapentin, and has only since restarted it yesterday. Symptoms are moderate, persistent, and are not bilateral.   Hypertension: Well controlled  Coccydynia: Fell on a trampoline approximately 5 months ago, persistent pain over the coccyx without radiation. 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  Past medical history, Surgical history, Family history not pertinant except as noted below, Social history, Allergies, and medications have been entered into the medical record, reviewed, and no changes needed.   Review of Systems: No fevers, chills, night sweats, weight loss, chest pain, or shortness of breath.   Objective:    General: Well Developed, well nourished, and in no acute distress.  Neuro: Alert and oriented x3, extra-ocular muscles intact, sensation grossly intact.  HEENT: Normocephalic, atraumatic, pupils equal round reactive to light, neck supple, no masses, no lymphadenopathy, thyroid nonpalpable.  Skin: Warm and dry, no rashes. Cardiac: Regular rate and rhythm, no murmurs rubs or gallops, no lower extremity edema.  Respiratory: Clear to auscultation bilaterally. Not using accessory muscles, speaking in full sentences. Left Ankle: No visible erythema or swelling. Range of motion is full in all directions. Strength is 5/5 in all directions. Stable lateral and medial ligaments; squeeze test and kleiger test unremarkable; Talar dome nontender; No pain at base of 5th MT; No tenderness over cuboid; No tenderness over N spot or navicular prominence No tenderness on posterior aspects of lateral and medial malleolus No sign of peroneal tendon subluxations; Negative tarsal tunnel tinel's Able to walk 4 steps. Coccyx: Tender to palpation    Impression and Recommendations:    I spent 40 minutes with this patient, greater than 50% was face-to-face time counseling regarding the above diagnoses

## 2016-02-10 ENCOUNTER — Telehealth: Payer: Self-pay

## 2016-02-10 ENCOUNTER — Encounter: Payer: Self-pay | Admitting: Sports Medicine

## 2016-02-10 NOTE — Telephone Encounter (Signed)
Spoke to [name] at pharmacy with instructions.

## 2016-02-10 NOTE — Telephone Encounter (Signed)
Yes, both of them, he is to discontinue his lone metformin, using both of those medications we will get to the max dose of metformin as well as have SGLT2 inhibitor treatment and DPP4 inhibitor treatment.

## 2016-03-04 ENCOUNTER — Encounter: Payer: Self-pay | Admitting: Sports Medicine

## 2016-03-04 ENCOUNTER — Ambulatory Visit (INDEPENDENT_AMBULATORY_CARE_PROVIDER_SITE_OTHER): Payer: BLUE CROSS/BLUE SHIELD

## 2016-03-04 ENCOUNTER — Ambulatory Visit (INDEPENDENT_AMBULATORY_CARE_PROVIDER_SITE_OTHER): Payer: BLUE CROSS/BLUE SHIELD | Admitting: Sports Medicine

## 2016-03-04 VITALS — BP 112/66 | HR 69 | Wt 203.9 lb

## 2016-03-04 DIAGNOSIS — M25572 Pain in left ankle and joints of left foot: Secondary | ICD-10-CM

## 2016-03-04 DIAGNOSIS — E1142 Type 2 diabetes mellitus with diabetic polyneuropathy: Secondary | ICD-10-CM | POA: Diagnosis not present

## 2016-03-04 MED ORDER — AMITRIPTYLINE HCL 50 MG PO TABS: ORAL_TABLET | ORAL | Status: DC

## 2016-03-04 NOTE — Assessment & Plan Note (Signed)
He is holding XigDuo for now, continues with saxagliptin/metformin. In 2 months if no improvement in A1c we will restart XigDuo.

## 2016-03-04 NOTE — Assessment & Plan Note (Signed)
nonreproducible burning sensation over the lateral malleolus, only with extreme dorsiflexion. 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 We are also going to consider an MRI of the ankle. I'm going to go ahead and get x-rays.

## 2016-03-04 NOTE — Progress Notes (Signed)
  Subjective:    CC:  Follow-up  HPI:  diabetes mellitus type 2: Has stopped XigDuo, currently on DPP 4 inhibitor and metformin. Has noted improvement in peripheral neuropathy.   Left ankle pain: Localized over the lateral malleolus, present for months. Worse with dorsiflexion of the ankle, no known injuries.  Past medical history, Surgical history, Family history not pertinant except as noted below, Social history, Allergies, and medications have been entered into the medical record, reviewed, and no changes needed.   Review of Systems: No fevers, chills, night sweats, weight loss, chest pain, or shortness of breath.   Objective:    General: Well Developed, well nourished, and in no acute distress.  Neuro: Alert and oriented x3, extra-ocular muscles intact, sensation grossly intact.  HEENT: Normocephalic, atraumatic, pupils equal round reactive to light, neck supple, no masses, no lymphadenopathy, thyroid nonpalpable.  Skin: Warm and dry, no rashes. Cardiac: Regular rate and rhythm, no murmurs rubs or gallops, no lower extremity edema.  Respiratory: Clear to auscultation bilaterally. Not using accessory muscles, speaking in full sentences.  Left Ankle: No visible erythema or swelling. Range of motion is full in all directions. Strength is 5/5 in all directions. Stable lateral and medial ligaments; squeeze test and kleiger test unremarkable; Talar dome nontender; No pain at base of 5th MT; No tenderness over cuboid; No tenderness over N spot or navicular prominence No tenderness on posterior aspects of lateral and medial malleolus No sign of peroneal tendon subluxations; Negative tarsal tunnel tinel's Able to walk 4 steps. Reproduction of pain with passive dorsiflexion of the ankle.  Impression and Recommendations:    I spent 25 minutes with this patient, greater than 50% was face-to-face time counseling regarding the above diagnoses

## 2016-03-15 ENCOUNTER — Encounter: Payer: Self-pay | Admitting: Sports Medicine

## 2016-03-15 MED ORDER — HYDROCODONE-ACETAMINOPHEN 5-325 MG PO TABS: 1.0000 | ORAL_TABLET | Freq: Three times a day (TID) | ORAL | Status: DC | PRN

## 2016-03-15 MED ORDER — PREDNISONE 50 MG PO TABS: ORAL_TABLET | ORAL | Status: DC

## 2016-04-11 ENCOUNTER — Encounter: Payer: Self-pay | Admitting: Sports Medicine

## 2016-05-06 ENCOUNTER — Ambulatory Visit: Payer: BLUE CROSS/BLUE SHIELD | Admitting: Sports Medicine

## 2016-05-10 ENCOUNTER — Ambulatory Visit (INDEPENDENT_AMBULATORY_CARE_PROVIDER_SITE_OTHER): Payer: BLUE CROSS/BLUE SHIELD | Admitting: Sports Medicine

## 2016-05-10 DIAGNOSIS — M25572 Pain in left ankle and joints of left foot: Secondary | ICD-10-CM

## 2016-05-10 MED ORDER — HYDROCODONE-ACETAMINOPHEN 5-325 MG PO TABS: 1.0000 | ORAL_TABLET | Freq: Three times a day (TID) | ORAL | Status: DC | PRN

## 2016-05-10 NOTE — Progress Notes (Signed)
  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 Minimal swollen, painful in the back of the ankle joint. Unable to walk. Needs to work tomorrow.  Past medical history, Surgical history, Family history not pertinant except as noted below, Social history, Allergies, and medications have been entered into the medical record, reviewed, and no changes needed.   Review of Systems: No fevers, chills, night sweats, weight loss, chest pain, or shortness of breath.   Objective:    General: Well Developed, well nourished, and in no acute distress.  Neuro: Alert and oriented x3, extra-ocular muscles intact, sensation grossly intact.  HEENT: Normocephalic, atraumatic, pupils equal round reactive to light, neck supple, no masses, no lymphadenopathy, thyroid nonpalpable.  Skin: Warm and dry, no rashes. Cardiac: Regular rate and rhythm, no murmurs rubs or gallops, no lower extremity edema.  Respiratory: Clear to auscultation bilaterally. Not using accessory muscles, speaking in full sentences. Left Ankle: Minimally swollen, tender to palpation at the posterior lateral talocrural joint. No referral of pain from the flexor pollicis longus, peroneals, flexor digitorum longus, or the flexor hallucis longus. Range of motion is full in all directions. Strength is 5/5 in all directions. Stable lateral and medial ligaments; squeeze test and kleiger test unremarkable; Talar dome nontender; No pain at base of 5th MT; No tenderness over cuboid; No tenderness over N spot or navicular prominence No tenderness on posterior aspects of lateral and medial malleolus No sign of peroneal tendon subluxations; Negative tarsal tunnel tinel's  Procedure: Real-time Ultrasound Guided Injection of left ankle joint Device: GE Logiq E  Verbal informed consent obtained.  Time-out conducted.  Noted no  overlying erythema, induration, or other signs of local infection.  Skin prepped in a sterile fashion.  Local anesthesia: Topical Ethyl chloride.  With sterile technique and under real time ultrasound guidance:  Noted small effusion from the posterior talocrural joint, advanced needle from a posterior lateral approach in to this effusion and I injected 1 mL kenalog 40, 1 mL lidocaine, 1 mL Marcaine. Completed without difficulty  Pain immediately resolved suggesting accurate placement of the medication.  Advised to call if fevers/chills, erythema, induration, drainage, or persistent bleeding.  Images permanently stored and available for review in the ultrasound unit.  Impression: Technically successful ultrasound guided injection.  Impression and Recommendations:

## 2016-05-10 NOTE — Assessment & Plan Note (Signed)
Persistent and recurrent severe pain at the back of the ankle, referable this time to the ankle joint. Noted small effusion from the posterior ankle joint. Still awaiting MRI but due to severity of pain I injected the ankle joint from a posterior approach. Adding a bit of hydrocodone.  Return to see me in one month, hopefully we'll have the MRI by then.

## 2016-05-17 ENCOUNTER — Ambulatory Visit (INDEPENDENT_AMBULATORY_CARE_PROVIDER_SITE_OTHER): Payer: BLUE CROSS/BLUE SHIELD

## 2016-05-17 ENCOUNTER — Ambulatory Visit: Payer: BLUE CROSS/BLUE SHIELD | Admitting: Sports Medicine

## 2016-05-17 DIAGNOSIS — R609 Edema, unspecified: Secondary | ICD-10-CM

## 2016-05-27 ENCOUNTER — Encounter: Payer: Self-pay | Admitting: Sports Medicine

## 2016-05-27 ENCOUNTER — Ambulatory Visit (INDEPENDENT_AMBULATORY_CARE_PROVIDER_SITE_OTHER): Payer: BLUE CROSS/BLUE SHIELD | Admitting: Sports Medicine

## 2016-05-27 VITALS — BP 144/91 | HR 89 | Wt 210.0 lb

## 2016-05-27 DIAGNOSIS — R202 Paresthesia of skin: Secondary | ICD-10-CM | POA: Diagnosis not present

## 2016-05-27 DIAGNOSIS — E1142 Type 2 diabetes mellitus with diabetic polyneuropathy: Secondary | ICD-10-CM | POA: Diagnosis not present

## 2016-05-27 DIAGNOSIS — M25572 Pain in left ankle and joints of left foot: Secondary | ICD-10-CM | POA: Diagnosis not present

## 2016-05-27 DIAGNOSIS — G5603 Carpal tunnel syndrome, bilateral upper limbs: Secondary | ICD-10-CM | POA: Insufficient documentation

## 2016-05-27 DIAGNOSIS — R200 Anesthesia of skin: Secondary | ICD-10-CM

## 2016-05-27 LAB — POCT GLYCOSYLATED HEMOGLOBIN (HGB A1C): Hemoglobin A1C: 7.9

## 2016-05-27 MED ORDER — GLIPIZIDE 10 MG PO TABS: 10.0000 mg | ORAL_TABLET | Freq: Two times a day (BID) | ORAL | Status: DC

## 2016-05-27 NOTE — Progress Notes (Signed)
  Subjective:    CC: Follow-up  HPI: Ankle pain: Did well after a tibiotalar injection from a posterior approach at the last visit. MRI showed tibiotalar joint effusion as well as some nonspecific edema in Kager's fat pad behind the Achilles.  Diabetes mellitus type 2: A1c has stabilized on 7.9 on current medications, agreeable to add something else.  Hand numbness: Right thumb and forefinger, no trauma, unclear etiology.  Past medical history, Surgical history, Family history not pertinant except as noted below, Social history, Allergies, and medications have been entered into the medical record, reviewed, and no changes needed.   Review of Systems: No fevers, chills, night sweats, weight loss, chest pain, or shortness of breath.   Objective:    General: Well Developed, well nourished, and in no acute distress.  Neuro: Alert and oriented x3, extra-ocular muscles intact, sensation grossly intact.  HEENT: Normocephalic, atraumatic, pupils equal round reactive to light, neck supple, no masses, no lymphadenopathy, thyroid nonpalpable.  Skin: Warm and dry, no rashes. Cardiac: Regular rate and rhythm, no murmurs rubs or gallops, no lower extremity edema.  Respiratory: Clear to auscultation bilaterally. Not using accessory muscles, speaking in full sentences.  Impression and Recommendations:   I spent 25 minutes with this patient, greater than 50% was face-to-face time counseling regarding the above diagnoses

## 2016-05-27 NOTE — Assessment & Plan Note (Signed)
Injected the posterior ankle joint, MRI did show some outpouching of the tibiotalar joint capsule as well as nonspecific edema in Kagers fat-pad. Pain has been good since the injection, we will keep an eye on this

## 2016-05-27 NOTE — Assessment & Plan Note (Addendum)
Referral to neurology for further evaluation.  I suspect there may be an element of carpal tunnel syndrome, this is not classic for diabetic peripheral neuropathy, he may end up with a nerve conduction study which will be very helpful.

## 2016-05-27 NOTE — Assessment & Plan Note (Signed)
A1c has stabilized at 7.9. Continue Kombiglyze, XigDuo, adding glipizide.

## 2016-05-28 ENCOUNTER — Encounter: Payer: Self-pay | Admitting: Sports Medicine

## 2016-05-28 MED ORDER — HYDROCODONE-ACETAMINOPHEN 5-325 MG PO TABS: 1.0000 | ORAL_TABLET | Freq: Three times a day (TID) | ORAL | Status: DC | PRN

## 2016-05-28 MED ORDER — DAPAGLIFLOZIN PRO-METFORMIN ER 10-1000 MG PO TB24: 1.0000 | ORAL_TABLET | Freq: Every day | ORAL | Status: DC

## 2016-05-28 NOTE — Addendum Note (Signed)
Addended by: Monica BectonHEKKEKANDAM, THOMAS J on: 05/28/2016 03:00 PM   Modules accepted: Orders, Medications

## 2016-05-31 ENCOUNTER — Encounter: Payer: Self-pay | Admitting: Sports Medicine

## 2016-05-31 ENCOUNTER — Ambulatory Visit (INDEPENDENT_AMBULATORY_CARE_PROVIDER_SITE_OTHER): Payer: BLUE CROSS/BLUE SHIELD | Admitting: Sports Medicine

## 2016-05-31 VITALS — BP 156/84 | HR 114 | Resp 18 | Wt 210.0 lb

## 2016-05-31 DIAGNOSIS — M25572 Pain in left ankle and joints of left foot: Secondary | ICD-10-CM | POA: Diagnosis not present

## 2016-05-31 NOTE — Assessment & Plan Note (Signed)
Kager's fat pad injection as above, return as needed.

## 2016-05-31 NOTE — Progress Notes (Signed)
  Subjective:    CC: Follow-up  HPI: Left ankle pain: Initially did well after a posterior ankle joint recess injection, unfortunately had a recurrence of burning pain, MRI did show an odd edema in kager's fat pad just deep to the mid Achilles. His pain is been intermittent, there is very little if not none today, he still desires his injection.    Past medical history, Surgical history, Family history not pertinant except as noted below, Social history, Allergies, and medications have been entered into the medical record, reviewed, and no changes needed.   Review of Systems: No fevers, chills, night sweats, weight loss, chest pain, or shortness of breath.   Objective:    General: Well Developed, well nourished, and in no acute distress.  Neuro: Alert and oriented x3, extra-ocular muscles intact, sensation grossly intact.  HEENT: Normocephalic, atraumatic, pupils equal round reactive to light, neck supple, no masses, no lymphadenopathy, thyroid nonpalpable.  Skin: Warm and dry, no rashes. Cardiac: Regular rate and rhythm, no murmurs rubs or gallops, no lower extremity edema.  Respiratory: Clear to auscultation bilaterally. Not using accessory muscles, speaking in full sentences.  Procedure: Real-time Ultrasound Guided Injection of left Kager's fat pad Device: GE Logiq E  Verbal informed consent obtained.  Time-out conducted.  Noted no overlying erythema, induration, or other signs of local infection.  Skin prepped in a sterile fashion.  Local anesthesia: Topical Ethyl chloride.  With sterile technique and under real time ultrasound guidance:  Taking care to avoid intra-Achilles injection, I guided the needle to just between the fat pad in the Achilles, I then injected 1 mL kenalog 40, 1 mL lidocaine, 1 mL Marcaine. Completed without difficulty  Pain immediately resolved suggesting accurate placement of the medication.  Advised to call if fevers/chills, erythema, induration, drainage, or  persistent bleeding.  Images permanently stored and available for review in the ultrasound unit.  Impression: Technically successful ultrasound guided injection.  Strapped with compressive dressing.  Impression and Recommendations:

## 2016-05-31 NOTE — Addendum Note (Signed)
Addended by: Baird KayUGLAS, Rebekha Diveley M on: 05/31/2016 05:07 PM   Modules accepted: Orders, Medications

## 2016-06-24 ENCOUNTER — Encounter: Payer: Self-pay | Admitting: Neurology

## 2016-06-24 ENCOUNTER — Ambulatory Visit (INDEPENDENT_AMBULATORY_CARE_PROVIDER_SITE_OTHER): Payer: BLUE CROSS/BLUE SHIELD | Admitting: Neurology

## 2016-06-24 VITALS — BP 128/76 | HR 74 | Ht 74.0 in | Wt 208.4 lb

## 2016-06-24 DIAGNOSIS — G629 Polyneuropathy, unspecified: Secondary | ICD-10-CM

## 2016-06-24 NOTE — Patient Instructions (Addendum)
Remember to drink plenty of fluid, eat healthy meals and do not skip any meals. Try to eat protein with a every meal and eat a healthy snack such as fruit or nuts in between meals. Try to keep a regular sleep-wake schedule and try to exercise daily, particularly in the form of walking, 20-30 minutes a day, if you can.   As far as diagnostic testing: Labs, emg/ncs  Our phone number is [phone]. We also have an after hours call service for urgent matters and there is a physician on-call for urgent questions. For any emergencies you know to call 911 or go to the nearest emergency room

## 2016-06-24 NOTE — Progress Notes (Signed)
[facility]    Provider:  Dr Lucia Gaskins Referring Provider: Monica Becton Primary Care Physician:  Rodney Langton, MD  CC:  Sensory changes  HPI:  Danny Schroeder is a 45 y.o. male here as a referral from Dr. Benjamin Stain for left ankle and right thumb sensory changes. Both feet with numbness over a year, started in the right foot and resolved and now in the left foot. The toes of his left foot with numbness. He has numbness on the top of the left foot and also in the achilles area, in the right hand on the inside of the right thumb is numb. Not tingling, not burning.  Numbness is just in the left foot, the right totally resolved. He has had diabetes for years that is not very well controlled, last HgbA1c 8. No weakness. He is wearing a foot brace which helps on the left. He has numbness on the top of the left foot medial border. There was no trauma associated with the right thumb numbness, no inciting events, Just on the medial border of the right thumb. Nothing makes his symptoms better or worse. No tingling no burning in the hands. He does have burning in the feet but not in the toes. Symptoms are not worse or overnight. No neck pain or low back pain or radicular symptoms.  He has vertigo. Quite a few times he has vertigo, associated with movement, he gets very sensitive to motion. No other focal neurologic deficits or complaints. No changes in bowel and bladder history of urinary incontinence or retention. No vision changes or headaches. No weakness. Denies low back pain causing any of the problems. No family history of neuropathy.   Reviewed notes, labs and imaging from outside physicians, which showed:   HgbA1c 7.9, LDL 88, BUN 16, Creat 0.83, TSH WNL  MRI left ankle:  Peroneal: Peroneal longus tendon intact. Peroneal brevis intact.  Posteromedial: Posterior tibial tendon intact. Flexor hallucis longus tendon intact. Flexor digitorum longus tendon  intact.  Anterior: Tibialis anterior tendon intact. Extensor hallucis longus tendon intact Extensor digitorum longus tendon intact.  Achilles:  Intact. Mild edema in Kager's fat.  Plantar Fascia: Intact.  LIGAMENTS  Lateral: Anterior talofibular ligament intact. Posterior talofibular ligament intact. Anterior and posterior tibiofibular ligaments intact.  Medial: Deltoid ligament intact. Spring ligament intact.  CARTILAGE  Ankle Joint: No joint effusion. Normal ankle mortise. No chondral defect.  Subtalar Joints/Sinus Tarsi: Normal subtalar joints. No subtalar joint effusion. Normal sinus tarsi.  Bones: Limited evaluation of the fifth metatarsal secondary to poor fat saturation. No marrow signal abnormality. No fracture or dislocation.  Soft Tissue: No other soft tissue abnormality. No fluid collection or hematoma.  IMPRESSION: 1. Intact Achilles tendon with mild nonspecific edema in Kager's Fat.  XR lumbar spine 2014: Personally reviewed images and agree with the following:  LUMBAR SPINE - 2-3 VIEW  Comparison: None.  Findings:  Frontal, lateral, and spot lumbosacral lateral images were obtained.  There are five non-rib bearing lumbar type vertebral bodies.  There is no fracture or spondylolisthesis.  Disc spaces appear intact.  No erosive change.  IMPRESSION: No abnormality noted.  Review of Systems: Patient complains of symptoms per HPI as well as the following symptoms: No chest pain, no shortness of breath. Pertinent negatives per HPI. All others negative.   Social History   Social History  . Marital status: Married    Spouse name: Danny Schroeder   . Number of children: 1  . Years of education: 36  Occupational History  . Police Officer [employer]   Social History Main Topics  . Smoking status: Former Smoker    Quit date: 03/06/1993  . Smokeless tobacco: Never Used  . Alcohol use Yes     Comment: rarely  . Drug use: No  . Sexual  activity: Yes   Other Topics Concern  . Not on file   Social History Narrative   Lives with wife   Caffeine use: Soda- diet   Coffee- rare     Family History  Problem Relation Age of Onset  . Heart disease Mother   . Diabetes Mother   . Hypertension Mother   . Heart disease Father 54    CABG  . Diabetes Sister   . Hypertension Sister     Past Medical History:  Diagnosis Date  . Diabetes mellitus   . GERD (gastroesophageal reflux disease)   . Hyperlipidemia   . Hypertension   . Neuropathy [facility])     Past Surgical History:  Procedure Laterality Date  . KNEE SURGERY  2005   right    Current Outpatient Prescriptions  Medication Sig Dispense Refill  . aspirin EC 81 MG tablet Take 1 tablet (81 mg total) by mouth daily. 90 tablet 3  . Dapagliflozin-Metformin HCl ER 08-999 MG TB24 Take 1 tablet by mouth daily.    Marland Kitchen gabapentin (NEURONTIN) 300 MG capsule One tab PO qHS for a week, then BID for a week, then TID. May double weekly to a max of 3,600mg /day 180 capsule 3  . lisinopril-hydrochlorothiazide (PRINZIDE,ZESTORETIC) 20-25 MG tablet TAKE ONE TABLET BY MOUTH ONE TIME DAILY 90 tablet 3  . omeprazole (PRILOSEC) 20 MG capsule Take 1 capsule (20 mg total) by mouth daily. 30 capsule 11  . Saxagliptin-Metformin (KOMBIGLYZE XR) 03-999 MG TB24 Take 1 tablet by mouth daily. 30 tablet 11  . simvastatin (ZOCOR) 20 MG tablet TAKE ONE TABLET BY MOUTH NIGHTLY AT BEDTIME 90 tablet 3   No current facility-administered medications for this visit.     Allergies as of 06/24/2016 - Review Complete 05/31/2016  Allergen Reaction Noted  . Phentermine  12/13/2013    Vitals: BP 128/76   Pulse 74   Ht 6\' 2"  (1.88 m)   Wt 208 lb 6.4 oz (94.5 kg)   BMI 26.76 kg/m  Last Weight:  Wt Readings from Last 1 Encounters:  06/24/16 208 lb 6.4 oz (94.5 kg)   Last Height:   Ht Readings from Last 1 Encounters:  06/24/16 6\' 2"  (1.88 m)    Physical exam: Exam: Gen: NAD, conversant, well  nourised, well groomed                     CV: RRR, no MRG. No Carotid Bruits. No peripheral edema, warm, nontender Eyes: Conjunctivae clear without exudates or hemorrhage  Neuro: Detailed Neurologic Exam  Speech:    Speech is normal; fluent and spontaneous with normal comprehension.  Cognition:    The patient is oriented to person, place, and time;     recent and remote memory intact;     language fluent;     normal attention, concentration,     fund of knowledge Cranial Nerves:    The pupils are equal, round, and reactive to light. The fundi are normal and spontaneous venous pulsations are present. Visual fields are full to finger confrontation. Extraocular movements are intact. Trigeminal sensation is intact and the muscles of mastication are normal. The face is symmetric. The palate  elevates in the midline. Hearing intact. Voice is normal. Shoulder shrug is normal. The tongue has normal motion without fasciculations.   Coordination:    Normal finger to nose and heel to shin. Normal rapid alternating movements.   Gait:    Heel-toe and tandem gait are normal.   Motor Observation:    No asymmetry, no atrophy, and no involuntary movements noted. Tone:    Normal muscle tone.    Posture:    Posture is normal. normal erect    Strength:    Strength is V/V in the upper and lower limbs.      Sensation: Decreased pinprick in the left foot lateral border and dorsal surface laterally. Decreased pinprick of the medial thumb. Also has decreased pinprick and temperature gradient fashion distally in both legs. Intact proprioception and vibration distally.     Reflex Exam:  DTR's:     Deep tendon reflexes in the upper and lower extremities are normal bilaterally.   Toes:    The toes are downgoing bilaterally.   Clonus:    Clonus is absent.      Assessment/Plan:  45 year old male with history of numbness in the feet, numbness in the right thumb, recurrent vertigo. There could be 3  separate etiologies for these symptoms including diabetic neuropathy in the feet, possible local nerve injury in the thumb and the right hand but also need to check for carpal tunnel or other etiologies, peripheral vertigo possibly. Discussed MRI of the brain considering he has multiple separate neurologic complaints but will hold off on that at this time.   EMG left leg and right arm for numbness, evaluate for CTS and neuropathy, include radial sensory, palmar comparisons, saphenous sensory in addition to the other routine studies please.  Labs today to screen for other causes of neuropathy other than diabetes. Consider MRI of the brain w/wo contrast if workup is unremarkable   CC: Dr. Benjamin Stain  Naomie Dean, MD  [facility] [address] [place], [place] [address]  Phone [phone] Fax [phone]

## 2016-06-28 LAB — HEAVY METALS, BLOOD
Arsenic: 7 ug/L (ref 2–23)
Lead, Blood: NOT DETECTED ug/dL (ref 0–19)
MERCURY: NOT DETECTED ug/L (ref 0.0–14.9)

## 2016-06-28 LAB — ENA+DNA/DS+SJORGEN'S
ENA RNP Ab: 1.2 AI — ABNORMAL HIGH (ref 0.0–0.9)
ENA SM Ab Ser-aCnc: <0.2 AI (ref 0.0–0.9)
dsDNA Ab: 1 IU/mL (ref 0–9)

## 2016-06-28 LAB — ANA W/REFLEX: Anti Nuclear Antibody(ANA): POSITIVE — AB

## 2016-06-28 LAB — VITAMIN B6: VITAMIN B6: 84.8 ug/L — AB (ref 5.3–46.7)

## 2016-06-28 LAB — SJOGREN'S SYNDROME ANTIBODS(SSA + SSB): ENA SSB (LA) AB: 0.2 AI (ref 0.0–0.9)

## 2016-06-28 LAB — B. BURGDORFI ANTIBODIES

## 2016-06-28 LAB — METHYLMALONIC ACID, SERUM: METHYLMALONIC ACID: 136 nmol/L (ref 0–378)

## 2016-06-28 LAB — VITAMIN B1: Thiamine: 170.6 nmol/L (ref 66.5–200.0)

## 2016-06-28 LAB — VITAMIN B12: Vitamin B-12: 1472 pg/mL — ABNORMAL HIGH (ref 211–946)

## 2016-07-01 ENCOUNTER — Encounter: Payer: Self-pay | Admitting: Neurology

## 2016-07-01 ENCOUNTER — Telehealth: Payer: Self-pay | Admitting: *Deleted

## 2016-07-01 NOTE — Telephone Encounter (Signed)
Spoke to pt about lab results per Dr Lucia Gaskins note. Pt verbalized understanding. Advised I saw his mychart message and called him.

## 2016-07-01 NOTE — Telephone Encounter (Signed)
-----   Message from Antonia B Ahern, MD sent at 07/01/2016 10:20 AM EDT ----- Labs are unremarkable. His B6 is a little elevated, he probably does not need to take so much. His B12 looked great, nothing else of concern. Will discuss in detail at his emg/ncs thanks 

## 2016-07-01 NOTE — Telephone Encounter (Signed)
-----   Message from Anson Fret, MD sent at 07/01/2016 10:20 AM EDT ----- Labs are unremarkable. His B6 is a little elevated, he probably does not need to take so much. His B12 looked great, nothing else of concern. Will discuss in detail at his emg/ncs thanks

## 2016-07-29 ENCOUNTER — Encounter: Payer: BLUE CROSS/BLUE SHIELD | Admitting: Neurology

## 2016-08-16 ENCOUNTER — Ambulatory Visit (INDEPENDENT_AMBULATORY_CARE_PROVIDER_SITE_OTHER): Payer: BLUE CROSS/BLUE SHIELD | Admitting: Sports Medicine

## 2016-08-16 DIAGNOSIS — E1142 Type 2 diabetes mellitus with diabetic polyneuropathy: Secondary | ICD-10-CM | POA: Diagnosis not present

## 2016-08-16 LAB — POCT GLYCOSYLATED HEMOGLOBIN (HGB A1C): Hemoglobin A1C: 6.9

## 2016-08-16 MED ORDER — DICLOFENAC SODIUM 75 MG PO TBEC: 75.0000 mg | DELAYED_RELEASE_TABLET | Freq: Two times a day (BID) | ORAL | 3 refills | Status: DC

## 2016-08-16 MED ORDER — GLIPIZIDE 10 MG PO TABS: 10.0000 mg | ORAL_TABLET | Freq: Two times a day (BID) | ORAL | 3 refills | Status: DC

## 2016-08-16 MED ORDER — HYDROCODONE-ACETAMINOPHEN 5-325 MG PO TABS: 1.0000 | ORAL_TABLET | Freq: Three times a day (TID) | ORAL | 0 refills | Status: DC | PRN

## 2016-08-16 NOTE — Progress Notes (Signed)
  Subjective:    CC: Follow-up  HPI: Diabetes mellitus type 2: Previous A1c was 7.9, today is 6.9, improvement but still having some pain in the back of the left heel. We have done injections into the tibiotalar joint as well as just deep to the Achilles tendon into Kages fat pad, he did have some Kager's fat pad edema on a previous MRI. Not really getting much improvement with gabapentin. He is set up for a nerve conduction and EMG by neurology coming up.  Past medical history, Surgical history, Family history not pertinant except as noted below, Social history, Allergies, and medications have been entered into the medical record, reviewed, and no changes needed.   Review of Systems: No fevers, chills, night sweats, weight loss, chest pain, or shortness of breath.   Objective:    General: Well Developed, well nourished, and in no acute distress.  Neuro: Alert and oriented x3, extra-ocular muscles intact, sensation grossly intact.  HEENT: Normocephalic, atraumatic, pupils equal round reactive to light, neck supple, no masses, no lymphadenopathy, thyroid nonpalpable.  Skin: Warm and dry, no rashes. Cardiac: Regular rate and rhythm, no murmurs rubs or gallops, no lower extremity edema.  Respiratory: Clear to auscultation bilaterally. Not using accessory muscles, speaking in full sentences. Left Ankle: No visible erythema or swelling. Range of motion is full in all directions. Strength is 5/5 in all directions. Stable lateral and medial ligaments; squeeze test and kleiger test unremarkable; Talar dome nontender; No pain at base of 5th MT; No tenderness over cuboid; No tenderness over N spot or navicular prominence No tenderness on posterior aspects of lateral and medial malleolus No sign of peroneal tendon subluxations; Negative tarsal tunnel tinel's Able to walk 4 steps.  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 Adding diclofenac, hydrocodone for used as needed. He does have a nerve conduction study coming up with neurology. Hemoccult A1c is down to 6.9, adding glipizide 10 mg twice a day. Recheck in 3 months.  I spent 25 minutes with this patient, greater than 50% was face-to-face time counseling regarding the above diagnoses

## 2016-08-16 NOTE — Assessment & Plan Note (Signed)
Not extremely well controlled, with persistent peripheral neuropathy type symptoms in the left posterior heel. Adding diclofenac, hydrocodone for used as needed. He does have a nerve conduction study coming up with neurology. Hemoccult A1c is down to 6.9, adding glipizide 10 mg twice a day. Recheck in 3 months.

## 2016-08-20 ENCOUNTER — Other Ambulatory Visit: Payer: Self-pay | Admitting: Family Medicine

## 2016-08-26 ENCOUNTER — Ambulatory Visit (INDEPENDENT_AMBULATORY_CARE_PROVIDER_SITE_OTHER): Payer: BLUE CROSS/BLUE SHIELD | Admitting: Neurology

## 2016-08-26 ENCOUNTER — Ambulatory Visit (INDEPENDENT_AMBULATORY_CARE_PROVIDER_SITE_OTHER): Payer: Self-pay | Admitting: Neurology

## 2016-08-26 DIAGNOSIS — G629 Polyneuropathy, unspecified: Secondary | ICD-10-CM | POA: Diagnosis not present

## 2016-08-26 DIAGNOSIS — R202 Paresthesia of skin: Secondary | ICD-10-CM

## 2016-08-26 DIAGNOSIS — Z0289 Encounter for other administrative examinations: Secondary | ICD-10-CM

## 2016-08-26 MED ORDER — DULOXETINE HCL 30 MG PO CPEP: 30.0000 mg | ORAL_CAPSULE | Freq: Every day | ORAL | 12 refills | Status: DC

## 2016-08-26 NOTE — Progress Notes (Signed)
  [facility]    Provider:  Dr Lucia GaskinsAhern Referring Provider: Monica Bectonhekkekandam, Thomas J,* Primary Care Physician:  Rodney LangtonHEKKEKANDAM, THOMAS, MD  CC:  Sensory changes  HPI:  Danny Schroeder is a 45 y.o. male here as a referral from Dr. Benjamin Stainhekkekandam for left ankle and right thumb sensory changes. Both feet with numbness over a year, started in the right foot and resolved and now in the left foot. He has numbness on the top of the left foot and also in the achilles area, in the right hand on the inside of the right thumb is numb. Not tingling, not burning.  Numbness is just in the left foot, the right totally resolved. He has had diabetes for years that is not very well controlled, last HgbA1c 8. No weakness. He is wearing a foot brace which helps on the left. He has numbness on the top of the left foot medial border. There was no trauma associated with the right thumb numbness, no inciting events, Just on the medial border of the right thumb. Nothing makes his symptoms better or worse. No tingling no burning in the hands. Symptoms are not worse or overnight. No neck pain or low back pain or radicular symptoms.     Summary:   Nerve Conduction Studies were performed on the bilateral upper and left lower extremities.  The right median APB motor nerve showed prolonged distal onset latency (4.6 ms, N<4.0). The right Median 2nd Digit sensory nerve showed prolonged distal peak latency (4.4 ms, N<3.9). F Wave studies indicate that the right Median F wave has normal latency  The left median APB motor nerve showed prolonged distal onset latency (4.6 ms, N<4.0). The left Median 2nd Digit sensory nerve showed prolonged distal peak latency (4.5 ms, N<3.9).  F Wave studies indicate that the left Median F wave has normal latency.   Bilateral Ulnar ADM motor nerves were within normal limits. F Wave studies indicate that the bilateral Ulnar F waves have normal latencies BilateralUlnar 5th digit sensory  nerves were within normal limits. Bilateral Radial sensory nerves were within normal limits.  Left Peroneal motor nerve was within normal limits with normal F-wave latency Left Tibial motor nerve was within normal limits  with normal F-wave latency Left H reflex showed normal latency  Left Sural sensory nerve was within normal limits. Left Superficial Peroneal sensory nerve was within normal limits. Left Saphenous sensory nerve was within normal limits.  EMG needle study of selected left upper and left lower extremity muscles was performed. The following muscles were normal: Deltoid, Triceps, Pronator Teres, Opponens Pollicis, First Dorsal Interosseous, vastus medialis, anterior tibialis, medial gastrocnemius, extensor hallucis longus, abductor hallucis.   Conclusion:  There is electrophysiologic evidence of bilateral moderately-severe median neuropathy however patient appears asymptomatic. No suggestion of large-fiber polyneuropathy or radiculopathy. A small-fiber neuropathy cannot be detected by this exam.    Naomie DeanAntonia Jaunice Mirza, MD  [facility] [address] [place], [place] [id number]  Phone [phone] Fax [phone]

## 2016-08-26 NOTE — Patient Instructions (Signed)
Alpha-lipoic acid 400-600mg  daily  Duloxetine delayed-release capsules What is this medicine? DULOXETINE (doo LOX e teen) is used to treat depression, anxiety, and different types of chronic pain. This medicine may be used for other purposes; ask your health care provider or pharmacist if you have questions. What should I tell my health care provider before I take this medicine? They need to know if you have any of these conditions: -bipolar disorder or a family history of bipolar disorder -glaucoma -kidney disease -liver disease -suicidal thoughts or a previous suicide attempt -taken medicines called MAOIs like Carbex, Eldepryl, Marplan, Nardil, and Parnate within 14 days -an unusual reaction to duloxetine, other medicines, foods, dyes, or preservatives -pregnant or trying to get pregnant -breast-feeding How should I use this medicine? Take this medicine by mouth with a glass of water. Follow the directions on the prescription label. Do not cut, crush or chew this medicine. You can take this medicine with or without food. Take your medicine at regular intervals. Do not take your medicine more often than directed. Do not stop taking this medicine suddenly except upon the advice of your doctor. Stopping this medicine too quickly may cause serious side effects or your condition may worsen. A special MedGuide will be given to you by the pharmacist with each prescription and refill. Be sure to read this information carefully each time. Talk to your pediatrician regarding the use of this medicine in children. While this drug may be prescribed for children as young as 13 years of age for selected conditions, precautions do apply. Overdosage: If you think you have taken too much of this medicine contact a poison control center or emergency room at once. NOTE: This medicine is only for you. Do not share this medicine with others. What if I miss a dose? If you miss a dose, take it as soon as you can.  If it is almost time for your next dose, take only that dose. Do not take double or extra doses. What may interact with this medicine? Do not take this medicine with any of the following medications: -certain diet drugs like dexfenfluramine, fenfluramine -desvenlafaxine -linezolid -MAOIs like Azilect, Carbex, Eldepryl, Marplan, Nardil, and Parnate -methylene blue (intravenous) -milnacipran -thioridazine -venlafaxine This medicine may also interact with the following medications: -alcohol -aspirin and aspirin-like medicines -certain antibiotics like ciprofloxacin and enoxacin -certain medicines for blood pressure, heart disease, irregular heart beat -certain medicines for depression, anxiety, or psychotic disturbances -certain medicines for migraine headache like almotriptan, eletriptan, frovatriptan, naratriptan, rizatriptan, sumatriptan, zolmitriptan -certain medicines that treat or prevent blood clots like warfarin, enoxaparin, and dalteparin -cimetidine -fentanyl -lithium -NSAIDS, medicines for pain and inflammation, like ibuprofen or naproxen -phentermine -procarbazine -sibutramine -St. John's wort -theophylline -tramadol -tryptophan This list may not describe all possible interactions. Give your health care provider a list of all the medicines, herbs, non-prescription drugs, or dietary supplements you use. Also tell them if you smoke, drink alcohol, or use illegal drugs. Some items may interact with your medicine. What should I watch for while using this medicine? Tell your doctor if your symptoms do not get better or if they get worse. Visit your doctor or health care professional for regular checks on your progress. Because it may take several weeks to see the full effects of this medicine, it is important to continue your treatment as prescribed by your doctor. Patients and their families should watch out for new or worsening thoughts of suicide or depression. Also watch out  for sudden changes  in feelings such as feeling anxious, agitated, panicky, irritable, hostile, aggressive, impulsive, severely restless, overly excited and hyperactive, or not being able to sleep. If this happens, especially at the beginning of treatment or after a change in dose, call your health care professional. Bonita QuinYou may get drowsy or dizzy. Do not drive, use machinery, or do anything that needs mental alertness until you know how this medicine affects you. Do not stand or sit up quickly, especially if you are an older patient. This reduces the risk of dizzy or fainting spells. Alcohol may interfere with the effect of this medicine. Avoid alcoholic drinks. This medicine can cause an increase in blood pressure. This medicine can also cause a sudden drop in your blood pressure, which may make you feel faint and increase the chance of a fall. These effects are most common when you first start the medicine or when the dose is increased, or during use of other medicines that can cause a sudden drop in blood pressure. Check with your doctor for instructions on monitoring your blood pressure while taking this medicine. Your mouth may get dry. Chewing sugarless gum or sucking hard candy, and drinking plenty of water may help. Contact your doctor if the problem does not go away or is severe. What side effects may I notice from receiving this medicine? Side effects that you should report to your doctor or health care professional as soon as possible: -allergic reactions like skin rash, itching or hives, swelling of the face, lips, or tongue -changes in blood pressure -confusion -dark urine -dizziness -fast talking and excited feelings or actions that are out of control -fast, irregular heartbeat -fever -general ill feeling or flu-like symptoms -hallucination, loss of contact with reality -light-colored stools -loss of balance or coordination -redness, blistering, peeling or loosening of the skin,  including inside the mouth -right upper belly pain -seizures -suicidal thoughts or other mood changes -trouble concentrating -trouble passing urine or change in the amount of urine -unusual bleeding or bruising -unusually weak or tired -yellowing of the eyes or skin Side effects that usually do not require medical attention (report to your doctor or health care professional if they continue or are bothersome): -blurred vision -change in appetite -change in sex drive or performance -headache -increased sweating -nausea This list may not describe all possible side effects. Call your doctor for medical advice about side effects. You may report side effects to FDA at 1-800-FDA-1088. Where should I keep my medicine? Keep out of the reach of children. Store at room temperature between 20 and 25 degrees C (68 to 77 degrees F). Throw away any unused medicine after the expiration date. NOTE: This sheet is a summary. It may not cover all possible information. If you have questions about this medicine, talk to your doctor, pharmacist, or health care provider.    2016, Elsevier/Gold Standard. (2013-11-06 [id number])

## 2016-08-27 NOTE — Progress Notes (Signed)
See procedure note.

## 2016-09-05 NOTE — Procedures (Signed)
[facility]    Provider:  Dr Lucia GaskinsAhern Referring Provider: Monica Bectonhekkekandam, Thomas J,* Primary Care Physician:  Rodney LangtonHEKKEKANDAM, THOMAS, MD  CC:  Sensory changes  HPI:  Danny Schroeder is a 45 y.o. male here as a referral from Dr. Benjamin Stainhekkekandam for left ankle and right thumb sensory changes. Both feet with numbness over a year, started in the right foot and resolved and now in the left foot. He has numbness on the top of the left foot and also in the achilles area, in the right hand on the inside of the right thumb is numb. Not tingling, not burning.  Numbness is just in the left foot, the right totally resolved. He has had diabetes for years that is not very well controlled, last HgbA1c 8. No weakness. He is wearing a foot brace which helps on the left. He has numbness on the top of the left foot medial border. There was no trauma associated with the right thumb numbness, no inciting events, Just on the medial border of the right thumb. Nothing makes his symptoms better or worse. No tingling no burning in the hands. Symptoms are not worse or overnight. No neck pain or low back pain or radicular symptoms.     Summary:   Nerve Conduction Studies were performed on the bilateral upper and left lower extremities.  The right median APB motor nerve showed prolonged distal onset latency (4.6 ms, N<4.0). The right Median 2nd Digit sensory nerve showed prolonged distal peak latency (4.4 ms, N<3.9). F Wave studies indicate that the right Median F wave has normal latency  The left median APB motor nerve showed prolonged distal onset latency (4.6 ms, N<4.0). The left Median 2nd Digit sensory nerve showed prolonged distal peak latency (4.5 ms, N<3.9).  F Wave studies indicate that the left Median F wave has normal latency.   Bilateral Ulnar ADM motor nerves were within normal limits. F Wave studies indicate that the bilateral Ulnar F waves have normal latencies BilateralUlnar 5th digit sensory  nerves were within normal limits. Bilateral Radial sensory nerves were within normal limits.  Left Peroneal motor nerve was within normal limits with normal F-wave latency Left Tibial motor nerve was within normal limits  with normal F-wave latency Left H reflex showed normal latency  Left Sural sensory nerve was within normal limits. Left Superficial Peroneal sensory nerve was within normal limits. Left Saphenous sensory nerve was within normal limits.  EMG needle study of selected left upper and left lower extremity muscles was performed. The following muscles were normal: Deltoid, Triceps, Pronator Teres, Opponens Pollicis, First Dorsal Interosseous, vastus medialis, anterior tibialis, medial gastrocnemius, extensor hallucis longus, abductor hallucis.   Conclusion:  There is electrophysiologic evidence of bilateral moderately-severe median neuropathy however patient appears asymptomatic. No suggestion of large-fiber polyneuropathy or radiculopathy. A small-fiber neuropathy is not detected by this exam.    Naomie DeanAntonia Ahern, MD  [facility] [address] [place], [place] [id number]  Phone [phone] Fax [phone]

## 2016-09-13 ENCOUNTER — Encounter: Payer: Self-pay | Admitting: Sports Medicine

## 2016-09-14 ENCOUNTER — Encounter: Payer: Self-pay | Admitting: Sports Medicine

## 2016-09-14 ENCOUNTER — Ambulatory Visit: Payer: BLUE CROSS/BLUE SHIELD | Admitting: Sports Medicine

## 2016-09-14 ENCOUNTER — Ambulatory Visit (INDEPENDENT_AMBULATORY_CARE_PROVIDER_SITE_OTHER): Payer: BLUE CROSS/BLUE SHIELD | Admitting: Sports Medicine

## 2016-09-14 DIAGNOSIS — G5603 Carpal tunnel syndrome, bilateral upper limbs: Secondary | ICD-10-CM

## 2016-09-14 DIAGNOSIS — G8929 Other chronic pain: Secondary | ICD-10-CM

## 2016-09-14 DIAGNOSIS — M25572 Pain in left ankle and joints of left foot: Secondary | ICD-10-CM | POA: Diagnosis not present

## 2016-09-14 MED ORDER — PREDNISONE 50 MG PO TABS: ORAL_TABLET | ORAL | 0 refills | Status: DC

## 2016-09-14 MED ORDER — TRAMADOL HCL 50 MG PO TABS: ORAL_TABLET | ORAL | 0 refills | Status: DC

## 2016-09-14 MED ORDER — LIDOCAINE 5 % EX OINT: 1.0000 "application " | TOPICAL_OINTMENT | Freq: Every day | CUTANEOUS | 11 refills | Status: DC

## 2016-09-14 NOTE — Assessment & Plan Note (Signed)
Only minimal symptoms right now, no further evaluation needed

## 2016-09-14 NOTE — Assessment & Plan Note (Addendum)
Now with skin breakdown, an odd rash over the posterior Achilles. I do want to enlist the help of dermatology. Were also going to MRI, he does have significant soft tissue edema over the lateral malleolus itself, that feels to be significant subcutaneous, previous imaging studies have been negative. Also has significant burning in the right fifth toe. Adding 5 days of prednisone, topical lidocaine. We did take some pictures of the skin breakdown in the rash should it be improved by the time he sees the dermatologist. Lower extremity nerve conduction studies were negative. Has not yet started duloxetine.

## 2016-09-14 NOTE — Progress Notes (Signed)
  Subjective:    CC: Left ankle pain  HPI: We have had great difficulty determining the cause of this pleasant 45 year old male's ankle pain on the left and right toe burning. He is seen the neurologist, nerve conduction and EMG were fairly unremarkable with the exception of bilateral carpal tunnel syndrome, he was started on Cymbalta but has not yet taken the medication yet. He is here with severe pain that he localizes over the left lateral malleolus, with swelling. Also endorses occasional burning type sensations that are intractable in the right little toe.  Past medical history:  Negative.  See flowsheet/record as well for more information.  Surgical history: Negative.  See flowsheet/record as well for more information.  Family history: Negative.  See flowsheet/record as well for more information.  Social history: Negative.  See flowsheet/record as well for more information.  Allergies, and medications have been entered into the medical record, reviewed, and no changes needed.   Review of Systems: No fevers, chills, night sweats, weight loss, chest pain, or shortness of breath.   Objective:    General: Well Developed, well nourished, and in no acute distress.  Neuro: Alert and oriented x3, extra-ocular muscles intact, sensation grossly intact.  HEENT: Normocephalic, atraumatic, pupils equal round reactive to light, neck supple, no masses, no lymphadenopathy, thyroid nonpalpable.  Skin: Warm and dry, no rashes. Cardiac: Regular rate and rhythm, no murmurs rubs or gallops, no lower extremity edema.  Respiratory: Clear to auscultation bilaterally. Not using accessory muscles, speaking in full sentences. Left Ankle: Visible fullness as well as some breaking of the skin, with soft as seen in a positive Nikolski sign over the lateral malleolus itself without signs of bacterial superinfection, there is also hypopigmentation in a linear pattern across the anterior lower leg as well as a scaly,  papular-type rash over the Achilles. Range of motion is full in all directions. Strength is 5/5 in all directions. Stable lateral and medial ligaments; squeeze test and kleiger test unremarkable; Talar dome nontender; No pain at base of 5th MT; No tenderness over cuboid; No tenderness over N spot or navicular prominence No tenderness on posterior aspects of lateral and medial malleolus No sign of peroneal tendon subluxations; Negative tarsal tunnel tinel's Able to walk 4 steps.  Impression and Recommendations:    Carpal tunnel syndrome, bilateral Only minimal symptoms right now, no further evaluation needed  Left ankle pain Now with skin breakdown, an odd rash over the posterior Achilles. I do want to enlist the help of dermatology. Were also going to MRI, he does have significant soft tissue edema over the lateral malleolus itself, that feels to be significant subcutaneous, previous imaging studies have been negative. Also has significant burning in the right fifth toe. Adding 5 days of prednisone, topical lidocaine. We did take some pictures of the skin breakdown in the rash should it be improved by the time he sees the dermatologist. Lower extremity nerve conduction studies were negative. Has not yet started duloxetine.

## 2016-09-19 ENCOUNTER — Encounter: Payer: Self-pay | Admitting: Sports Medicine

## 2016-09-19 DIAGNOSIS — M25572 Pain in left ankle and joints of left foot: Principal | ICD-10-CM

## 2016-09-19 DIAGNOSIS — G8929 Other chronic pain: Secondary | ICD-10-CM

## 2016-09-22 ENCOUNTER — Encounter: Payer: Self-pay | Admitting: Sports Medicine

## 2016-09-23 ENCOUNTER — Encounter: Payer: Self-pay | Admitting: Sports Medicine

## 2016-10-03 ENCOUNTER — Other Ambulatory Visit: Payer: Self-pay | Admitting: Sports Medicine

## 2016-10-05 ENCOUNTER — Other Ambulatory Visit: Payer: Self-pay | Admitting: *Deleted

## 2016-10-05 ENCOUNTER — Other Ambulatory Visit: Payer: Self-pay | Admitting: Sports Medicine

## 2016-10-05 DIAGNOSIS — M25572 Pain in left ankle and joints of left foot: Principal | ICD-10-CM

## 2016-10-05 DIAGNOSIS — G8929 Other chronic pain: Secondary | ICD-10-CM

## 2016-10-05 MED ORDER — DULOXETINE HCL 30 MG PO CPEP: 30.0000 mg | ORAL_CAPSULE | Freq: Every day | ORAL | 3 refills | Status: DC

## 2016-10-14 ENCOUNTER — Encounter: Payer: Self-pay | Admitting: Sports Medicine

## 2016-10-14 DIAGNOSIS — L909 Atrophic disorder of skin, unspecified: Principal | ICD-10-CM

## 2016-10-14 DIAGNOSIS — R238 Other skin changes: Secondary | ICD-10-CM

## 2016-10-15 ENCOUNTER — Encounter: Payer: Self-pay | Admitting: Sports Medicine

## 2016-10-18 ENCOUNTER — Other Ambulatory Visit: Payer: Self-pay | Admitting: Sports Medicine

## 2016-10-18 DIAGNOSIS — L989 Disorder of the skin and subcutaneous tissue, unspecified: Secondary | ICD-10-CM

## 2016-11-06 ENCOUNTER — Other Ambulatory Visit: Payer: Self-pay | Admitting: Sports Medicine

## 2016-11-06 DIAGNOSIS — G8929 Other chronic pain: Secondary | ICD-10-CM

## 2016-11-06 DIAGNOSIS — M25572 Pain in left ankle and joints of left foot: Principal | ICD-10-CM

## 2016-11-18 ENCOUNTER — Encounter: Payer: Self-pay | Admitting: Sports Medicine

## 2016-11-18 DIAGNOSIS — E1142 Type 2 diabetes mellitus with diabetic polyneuropathy: Secondary | ICD-10-CM

## 2016-11-19 ENCOUNTER — Ambulatory Visit: Payer: BLUE CROSS/BLUE SHIELD

## 2016-11-19 MED ORDER — HYDROCODONE-ACETAMINOPHEN 5-325 MG PO TABS: 1.0000 | ORAL_TABLET | Freq: Three times a day (TID) | ORAL | 0 refills | Status: DC | PRN

## 2016-11-25 ENCOUNTER — Encounter: Payer: Self-pay | Admitting: Sports Medicine

## 2016-11-25 ENCOUNTER — Ambulatory Visit (INDEPENDENT_AMBULATORY_CARE_PROVIDER_SITE_OTHER): Payer: BLUE CROSS/BLUE SHIELD | Admitting: Sports Medicine

## 2016-11-25 DIAGNOSIS — G8929 Other chronic pain: Secondary | ICD-10-CM

## 2016-11-25 DIAGNOSIS — E1142 Type 2 diabetes mellitus with diabetic polyneuropathy: Secondary | ICD-10-CM | POA: Diagnosis not present

## 2016-11-25 DIAGNOSIS — M25572 Pain in left ankle and joints of left foot: Secondary | ICD-10-CM | POA: Diagnosis not present

## 2016-11-25 LAB — POCT GLYCOSYLATED HEMOGLOBIN (HGB A1C): Hemoglobin A1C: 5.9

## 2016-11-25 NOTE — Assessment & Plan Note (Signed)
Finally doing some good healing of the skin over the posterior Achilles and lateral malleolus. He did discuss this with dermatologist who recommended a topical steroid, and mobilization of fluid type activities.

## 2016-11-25 NOTE — Progress Notes (Signed)
  Subjective:    CC: Follow-up  HPI: This is a pleasant 45 year old male [place], he has diabetes which is well controlled, we have been dealing with an odd skin lesion on his posterior lateral ankle as well as intractable diabetic peripheral neuropathy.  Ultimately we sent him to dermatology, various steroid creams were prescribed and overall this is doing better. His neurologist recommended some complement 3 and alternative type medications such as evening primrose oil, several B vitamins, and alpha lipoic acid, interestingly his symptoms have improved. He tells me he has almost 0 neuropathic type symptoms.  Past medical history:  Negative.  See flowsheet/record as well for more information.  Surgical history: Negative.  See flowsheet/record as well for more information.  Family history: Negative.  See flowsheet/record as well for more information.  Social history: Negative.  See flowsheet/record as well for more information.  Allergies, and medications have been entered into the medical record, reviewed, and no changes needed.   Review of Systems: No fevers, chills, night sweats, weight loss, chest pain, or shortness of breath.   Objective:    General: Well Developed, well nourished, and in no acute distress.  Neuro: Alert and oriented x3, extra-ocular muscles intact, sensation grossly intact.  HEENT: Normocephalic, atraumatic, pupils equal round reactive to light, neck supple, no masses, no lymphadenopathy, thyroid nonpalpable.  Skin: Warm and dry, no rashes. Cardiac: Regular rate and rhythm, no murmurs rubs or gallops, no lower extremity edema.  Respiratory: Clear to auscultation bilaterally. Not using accessory muscles, speaking in full sentences. Left ankle: There is still some postinflammatory hyperpigmentation over the lateral malleolus, healing, as well as scaling, hyperpigmentation and a bit of raw skin over the posterior Achilles. Overall significantly  improved.  Impression and Recommendations:    Left ankle pain Finally doing some good healing of the skin over the posterior Achilles and lateral malleolus. He did discuss this with dermatologist who recommended a topical steroid, and mobilization of fluid type activities.   Diabetes mellitus, type 2 Labs have improved, he is taking some supplements, continue these. Peripheral neuropathy has all but resolved, he is currently doing his Cymbalta as well as evening primrose oil, as well as alpha lipoic acid.  Return in 3 months to recheck A1c.  I spent 25 minutes with this patient, greater than 50% was face-to-face time counseling regarding the above diagnoses

## 2016-11-25 NOTE — Assessment & Plan Note (Signed)
Labs have improved, he is taking some supplements, continue these. Peripheral neuropathy has all but resolved, he is currently doing his Cymbalta as well as evening primrose oil, as well as alpha lipoic acid.  Return in 3 months to recheck A1c.

## 2016-12-07 ENCOUNTER — Other Ambulatory Visit: Payer: Self-pay | Admitting: Sports Medicine

## 2016-12-07 DIAGNOSIS — E1142 Type 2 diabetes mellitus with diabetic polyneuropathy: Secondary | ICD-10-CM

## 2016-12-30 ENCOUNTER — Encounter: Payer: Self-pay | Admitting: Sports Medicine

## 2017-01-10 ENCOUNTER — Other Ambulatory Visit: Payer: Self-pay | Admitting: Sports Medicine

## 2017-01-10 MED ORDER — LINAGLIPTIN-METFORMIN HCL ER 5-1000 MG PO TB24: 1.0000 | ORAL_TABLET | Freq: Every day | ORAL | 11 refills | Status: DC

## 2017-02-22 ENCOUNTER — Ambulatory Visit (INDEPENDENT_AMBULATORY_CARE_PROVIDER_SITE_OTHER): Payer: 59 | Admitting: Sports Medicine

## 2017-02-22 ENCOUNTER — Encounter: Payer: Self-pay | Admitting: Sports Medicine

## 2017-02-22 VITALS — BP 108/69 | HR 83 | Resp 16 | Wt 200.6 lb

## 2017-02-22 DIAGNOSIS — Z23 Encounter for immunization: Secondary | ICD-10-CM | POA: Diagnosis not present

## 2017-02-22 DIAGNOSIS — Z0000 Encounter for general adult medical examination without abnormal findings: Secondary | ICD-10-CM

## 2017-02-22 DIAGNOSIS — M25572 Pain in left ankle and joints of left foot: Secondary | ICD-10-CM

## 2017-02-22 DIAGNOSIS — I10 Essential (primary) hypertension: Secondary | ICD-10-CM | POA: Diagnosis not present

## 2017-02-22 DIAGNOSIS — E785 Hyperlipidemia, unspecified: Secondary | ICD-10-CM | POA: Diagnosis not present

## 2017-02-22 DIAGNOSIS — G8929 Other chronic pain: Secondary | ICD-10-CM

## 2017-02-22 DIAGNOSIS — E1142 Type 2 diabetes mellitus with diabetic polyneuropathy: Secondary | ICD-10-CM

## 2017-02-22 NOTE — Assessment & Plan Note (Signed)
Rechecking labs, neuropathy seems to have resolved with alpha lipoic acid, Hemoccult A1c 3 months ago was 5.9%. Continue all current medications, he is due for his diabetic eye exam and pneumonia 13 shot.

## 2017-02-22 NOTE — Assessment & Plan Note (Signed)
HIV labs.

## 2017-02-22 NOTE — Assessment & Plan Note (Signed)
Well controlled, no changes 

## 2017-02-22 NOTE — Progress Notes (Signed)
  Subjective:    CC: Follow-up  HPI: This is a pleasant 46 year old male with diabetes, overall he's done well, his last hemoglobin A1c was 5.9%, we've dealt with some odd manifestations and diabetic peripheral neuropathy.  He does get occasional swelling of the left leg, he has seen dermatology, neurology. Ultimately with the addition about blood poke acid by neurology symptoms have improved significantly. He still has paresthesias on the dorsum of his left foot, the paresthesias on his right hand first webspace have improved.  Hypertension: Well controlled.  Preventive measures: Due for diabetic foot exam, HIV testing, pneumonia 13 vaccination.  Past medical history:  Negative.  See flowsheet/record as well for more information.  Surgical history: Negative.  See flowsheet/record as well for more information.  Family history: Negative.  See flowsheet/record as well for more information.  Social history: Negative.  See flowsheet/record as well for more information.  Allergies, and medications have been entered into the medical record, reviewed, and no changes needed.   Review of Systems: No fevers, chills, night sweats, weight loss, chest pain, or shortness of breath.   Objective:    General: Well Developed, well nourished, and in no acute distress.  Neuro: Alert and oriented x3, extra-ocular muscles intact, sensation grossly intact.  HEENT: Normocephalic, atraumatic, pupils equal round reactive to light, neck supple, no masses, no lymphadenopathy, thyroid nonpalpable.  Skin: Warm and dry, no rashes. Cardiac: Regular rate and rhythm, no murmurs rubs or gallops, no lower extremity edema.  Respiratory: Clear to auscultation bilaterally. Not using accessory muscles, speaking in full sentences.  Impression and Recommendations:    Diabetes mellitus, type 2 Rechecking labs, neuropathy seems to have resolved with alpha lipoic acid, Hemoccult A1c 3 months ago was 5.9%. Continue all current  medications, he is due for his diabetic eye exam and pneumonia 13 shot.  Essential hypertension, benign Well-controlled, no changes  Left ankle pain Good healing of the skin over the posterior Achilles and lateral malleolus. He does have a dermatologist who has recommended topical steroids and fluid mobilization activities.  Annual physical exam HIV labs.  I spent 25 minutes with this patient, greater than 50% was face-to-face time counseling regarding the above diagnoses

## 2017-02-22 NOTE — Assessment & Plan Note (Signed)
Good healing of the skin over the posterior Achilles and lateral malleolus. He does have a dermatologist who has recommended topical steroids and fluid mobilization activities.

## 2017-02-22 NOTE — Addendum Note (Signed)
Addended by: Baird KayUGLAS, Macaiah Mangal M on: 02/22/2017 09:42 AM   Modules accepted: Orders

## 2017-02-23 LAB — CBC
HCT: 44.4 % (ref 38.5–50.0)
Hemoglobin: 14.7 g/dL (ref 13.2–17.1)
MCH: 27.3 pg (ref 27.0–33.0)
MCHC: 33.1 g/dL (ref 32.0–36.0)
MCV: 82.4 fL (ref 80.0–100.0)
MPV: 9.9 fL (ref 7.5–12.5)
Platelets: 260 10*3/uL (ref 140–400)
RBC: 5.39 MIL/uL (ref 4.20–5.80)
RDW: 15 % (ref 11.0–15.0)
WBC: 8.2 10*3/uL (ref 3.8–10.8)

## 2017-02-23 LAB — COMPREHENSIVE METABOLIC PANEL
AST: 28 U/L (ref 10–40)
Albumin: 4.3 g/dL (ref 3.6–5.1)
BUN: 23 mg/dL (ref 7–25)
CO2: 28 mmol/L (ref 20–31)
Chloride: 97 mmol/L — ABNORMAL LOW (ref 98–110)
Creat: 0.96 mg/dL (ref 0.60–1.35)
Glucose, Bld: 82 mg/dL (ref 65–99)
Sodium: 139 mmol/L (ref 135–146)

## 2017-02-23 LAB — COMPREHENSIVE METABOLIC PANEL WITH GFR
ALT: 33 U/L (ref 9–46)
Alkaline Phosphatase: 58 U/L (ref 40–115)
Calcium: 9.6 mg/dL (ref 8.6–10.3)
Potassium: 3.6 mmol/L (ref 3.5–5.3)
Total Bilirubin: 0.5 mg/dL (ref 0.2–1.2)
Total Protein: 7.1 g/dL (ref 6.1–8.1)

## 2017-02-23 LAB — LIPID PANEL W/REFLEX DIRECT LDL
Cholesterol: 172 mg/dL (ref <200)
HDL: 46 mg/dL (ref >40)
LDL-Cholesterol: 106 mg/dL — ABNORMAL HIGH
Non-HDL Cholesterol (Calc): 126 mg/dL (ref <130)
Total CHOL/HDL Ratio: 3.7 Ratio (ref <5.0)
Triglycerides: 104 mg/dL (ref <150)

## 2017-02-23 LAB — HEMOGLOBIN A1C
Hgb A1c MFr Bld: 5.9 % — ABNORMAL HIGH (ref <5.7)
Mean Plasma Glucose: 123 mg/dL

## 2017-02-23 LAB — TSH: TSH: 0.58 m[IU]/L (ref 0.40–4.50)

## 2017-02-23 LAB — VITAMIN D 25 HYDROXY (VIT D DEFICIENCY, FRACTURES): Vit D, 25-Hydroxy: 22 ng/mL — ABNORMAL LOW (ref 30–100)

## 2017-02-23 LAB — HIV ANTIBODY (ROUTINE TESTING W REFLEX): HIV 1&2 Ab, 4th Generation: NONREACTIVE

## 2017-02-23 MED ORDER — VITAMIN D (ERGOCALCIFEROL) 1.25 MG (50000 UNIT) PO CAPS: 50000.0000 [IU] | ORAL_CAPSULE | ORAL | 0 refills | Status: DC

## 2017-02-23 MED ORDER — SIMVASTATIN 40 MG PO TABS: 40.0000 mg | ORAL_TABLET | Freq: Every day | ORAL | 3 refills | Status: DC

## 2017-02-23 NOTE — Addendum Note (Signed)
Addended by: Monica BectonHEKKEKANDAM, THOMAS J on: 02/23/2017 08:28 AM   Modules accepted: Orders

## 2017-02-23 NOTE — Assessment & Plan Note (Signed)
Labs look really good, lipids could be a few points lower, vitamin D is low, calling in supplement. I'm also going to increase simvastatin to 40 mg considering current lipids.

## 2017-03-09 ENCOUNTER — Other Ambulatory Visit: Payer: Self-pay | Admitting: Sports Medicine

## 2017-03-09 DIAGNOSIS — E1142 Type 2 diabetes mellitus with diabetic polyneuropathy: Secondary | ICD-10-CM

## 2017-03-22 ENCOUNTER — Other Ambulatory Visit: Payer: Self-pay | Admitting: Sports Medicine

## 2017-03-22 DIAGNOSIS — E1142 Type 2 diabetes mellitus with diabetic polyneuropathy: Secondary | ICD-10-CM

## 2017-04-14 ENCOUNTER — Encounter: Payer: Self-pay | Admitting: Sports Medicine

## 2017-04-17 ENCOUNTER — Other Ambulatory Visit: Payer: Self-pay | Admitting: Sports Medicine

## 2017-04-17 DIAGNOSIS — E785 Hyperlipidemia, unspecified: Secondary | ICD-10-CM

## 2017-04-24 ENCOUNTER — Other Ambulatory Visit: Payer: Self-pay | Admitting: Sports Medicine

## 2017-04-24 DIAGNOSIS — E1142 Type 2 diabetes mellitus with diabetic polyneuropathy: Secondary | ICD-10-CM

## 2017-05-03 ENCOUNTER — Other Ambulatory Visit: Payer: Self-pay | Admitting: Sports Medicine

## 2017-05-24 ENCOUNTER — Ambulatory Visit (INDEPENDENT_AMBULATORY_CARE_PROVIDER_SITE_OTHER): Payer: 59 | Admitting: Sports Medicine

## 2017-05-24 ENCOUNTER — Other Ambulatory Visit: Payer: Self-pay | Admitting: Sports Medicine

## 2017-05-24 DIAGNOSIS — E1142 Type 2 diabetes mellitus with diabetic polyneuropathy: Secondary | ICD-10-CM

## 2017-05-24 DIAGNOSIS — M25572 Pain in left ankle and joints of left foot: Secondary | ICD-10-CM | POA: Diagnosis not present

## 2017-05-24 DIAGNOSIS — G8929 Other chronic pain: Secondary | ICD-10-CM | POA: Diagnosis not present

## 2017-05-24 LAB — POCT GLYCOSYLATED HEMOGLOBIN (HGB A1C): Hemoglobin A1C: 5.7

## 2017-05-24 NOTE — Assessment & Plan Note (Signed)
Well controlled, no changes 

## 2017-05-24 NOTE — Progress Notes (Signed)
  Subjective:    CC:  Follow-up  HPI: This is a pleasant 46 year old male here for follow-up of diabetes, paresthesias in all extremities have resolved, he had a negative nerve conduction study and essentially a negative MRI of the left ankle, he still has some occasional weakness that doesn't prevent him from doing what he wants to do. Overall happy with how things have gone.  Past medical history:  Negative.  See flowsheet/record as well for more information.  Surgical history: Negative.  See flowsheet/record as well for more information.  Family history: Negative.  See flowsheet/record as well for more information.  Social history: Negative.  See flowsheet/record as well for more information.  Allergies, and medications have been entered into the medical record, reviewed, and no changes needed.   Review of Systems: No fevers, chills, night sweats, weight loss, chest pain, or shortness of breath.   Objective:    General: Well Developed, well nourished, and in no acute distress.  Neuro: Alert and oriented x3, extra-ocular muscles intact, sensation grossly intact.  HEENT: Normocephalic, atraumatic, pupils equal round reactive to light, neck supple, no masses, no lymphadenopathy, thyroid nonpalpable.  Skin: Warm and dry, no rashes. Cardiac: Regular rate and rhythm, no murmurs rubs or gallops, no lower extremity edema.  Respiratory: Clear to auscultation bilaterally. Not using accessory muscles, speaking in full sentences. Left Ankle: No visible erythema or swelling. Range of motion is full in all directions. Strength is 5/5 in all directions. Stable lateral and medial ligaments; squeeze test and kleiger test unremarkable; Talar dome nontender; No pain at base of 5th MT; No tenderness over cuboid; No tenderness over N spot or navicular prominence No tenderness on posterior aspects of lateral and medial malleolus No sign of peroneal tendon subluxations; Negative tarsal tunnel  tinel's Able to walk 4 steps.  Impression and Recommendations:    Diabetes mellitus, type 2 Well-controlled, no changes.  Left ankle pain Ankle feels a lot better than before, he does complain of some vague weakness, but paresthesias are gone. Nerve conduction study was negative, MRI was for the most part negative. Structurally and functionally his ankle is intact, I asked him to do some ankle strengthening exercises.  I spent 25 minutes with this patient, greater than 50% was face-to-face time counseling regarding the above diagnoses

## 2017-05-24 NOTE — Assessment & Plan Note (Signed)
Ankle feels a lot better than before, he does complain of some vague weakness, but paresthesias are gone. Nerve conduction study was negative, MRI was for the most part negative. Structurally and functionally his ankle is intact, I asked him to do some ankle strengthening exercises.

## 2017-07-07 IMAGING — CR DG SACRUM/COCCYX 2+V
3 series · 3 of 3 positions shown · non-contrast
Comparison: Lumbosacral spine January 23, 2013

CLINICAL DATA: Status post fall onto the buttocks from a height of
4 feet while zipper lining 5 months ago, persistent sacral region
pain.

EXAM:
SACRUM AND COCCYX - 2+ VIEW

[coccyx ap]
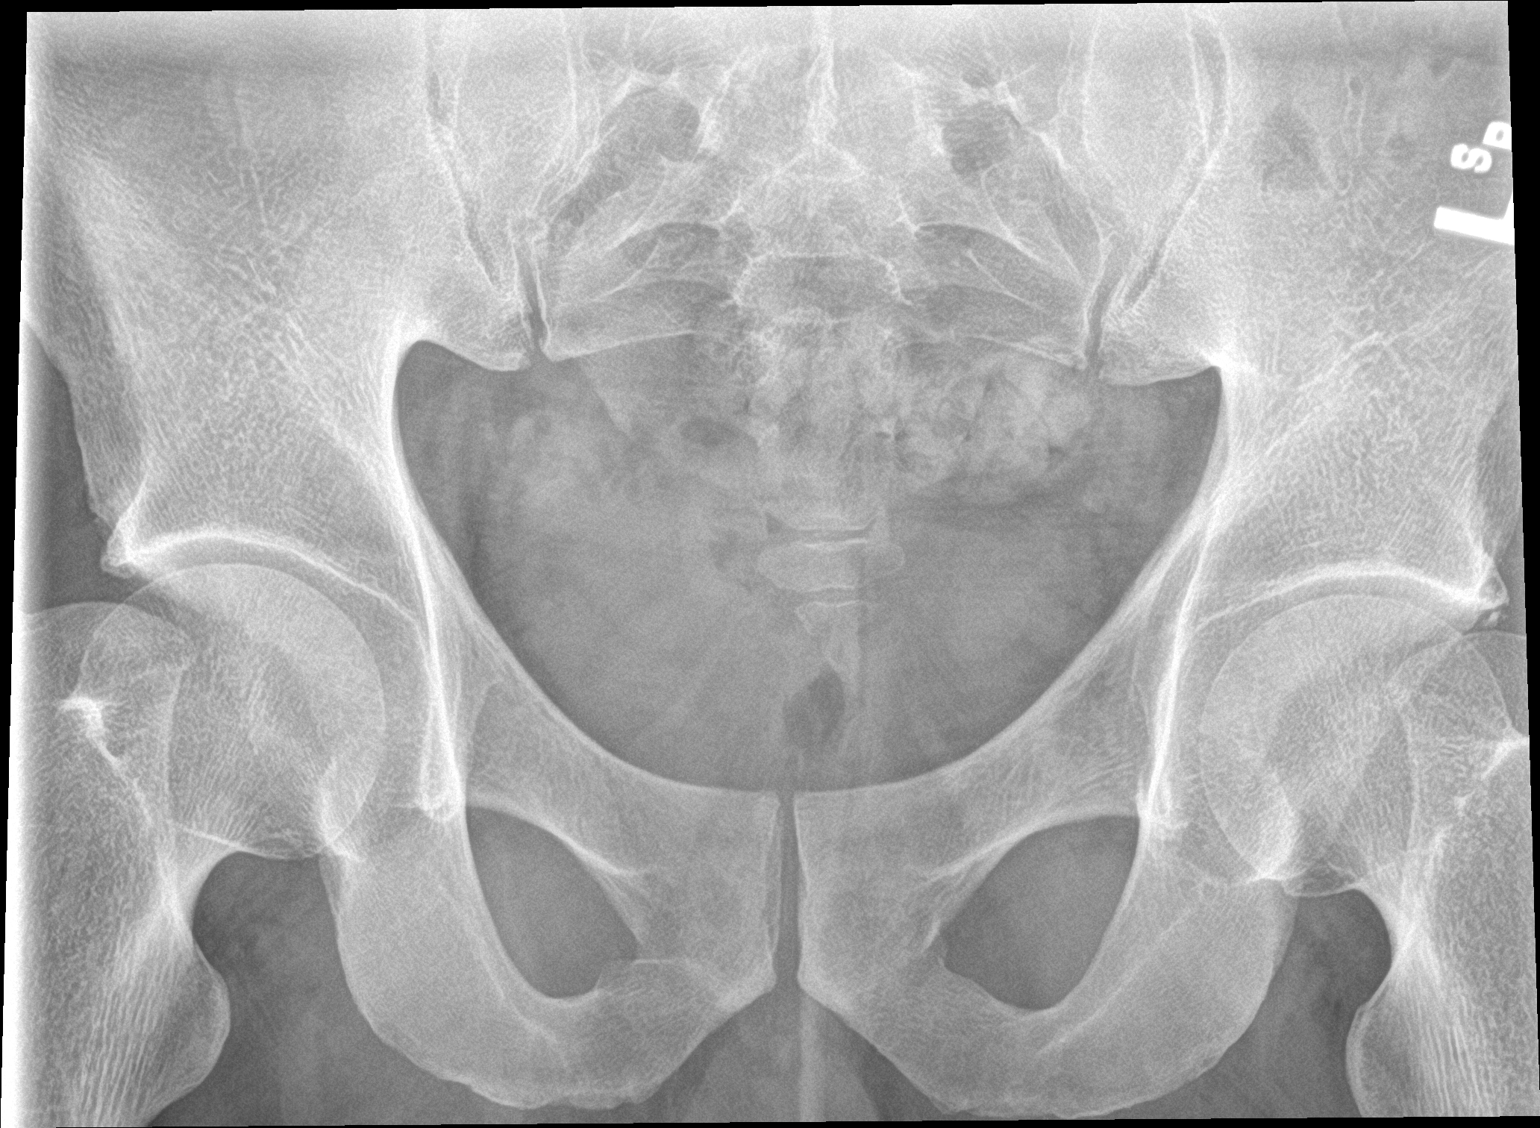

[sacrum ap]
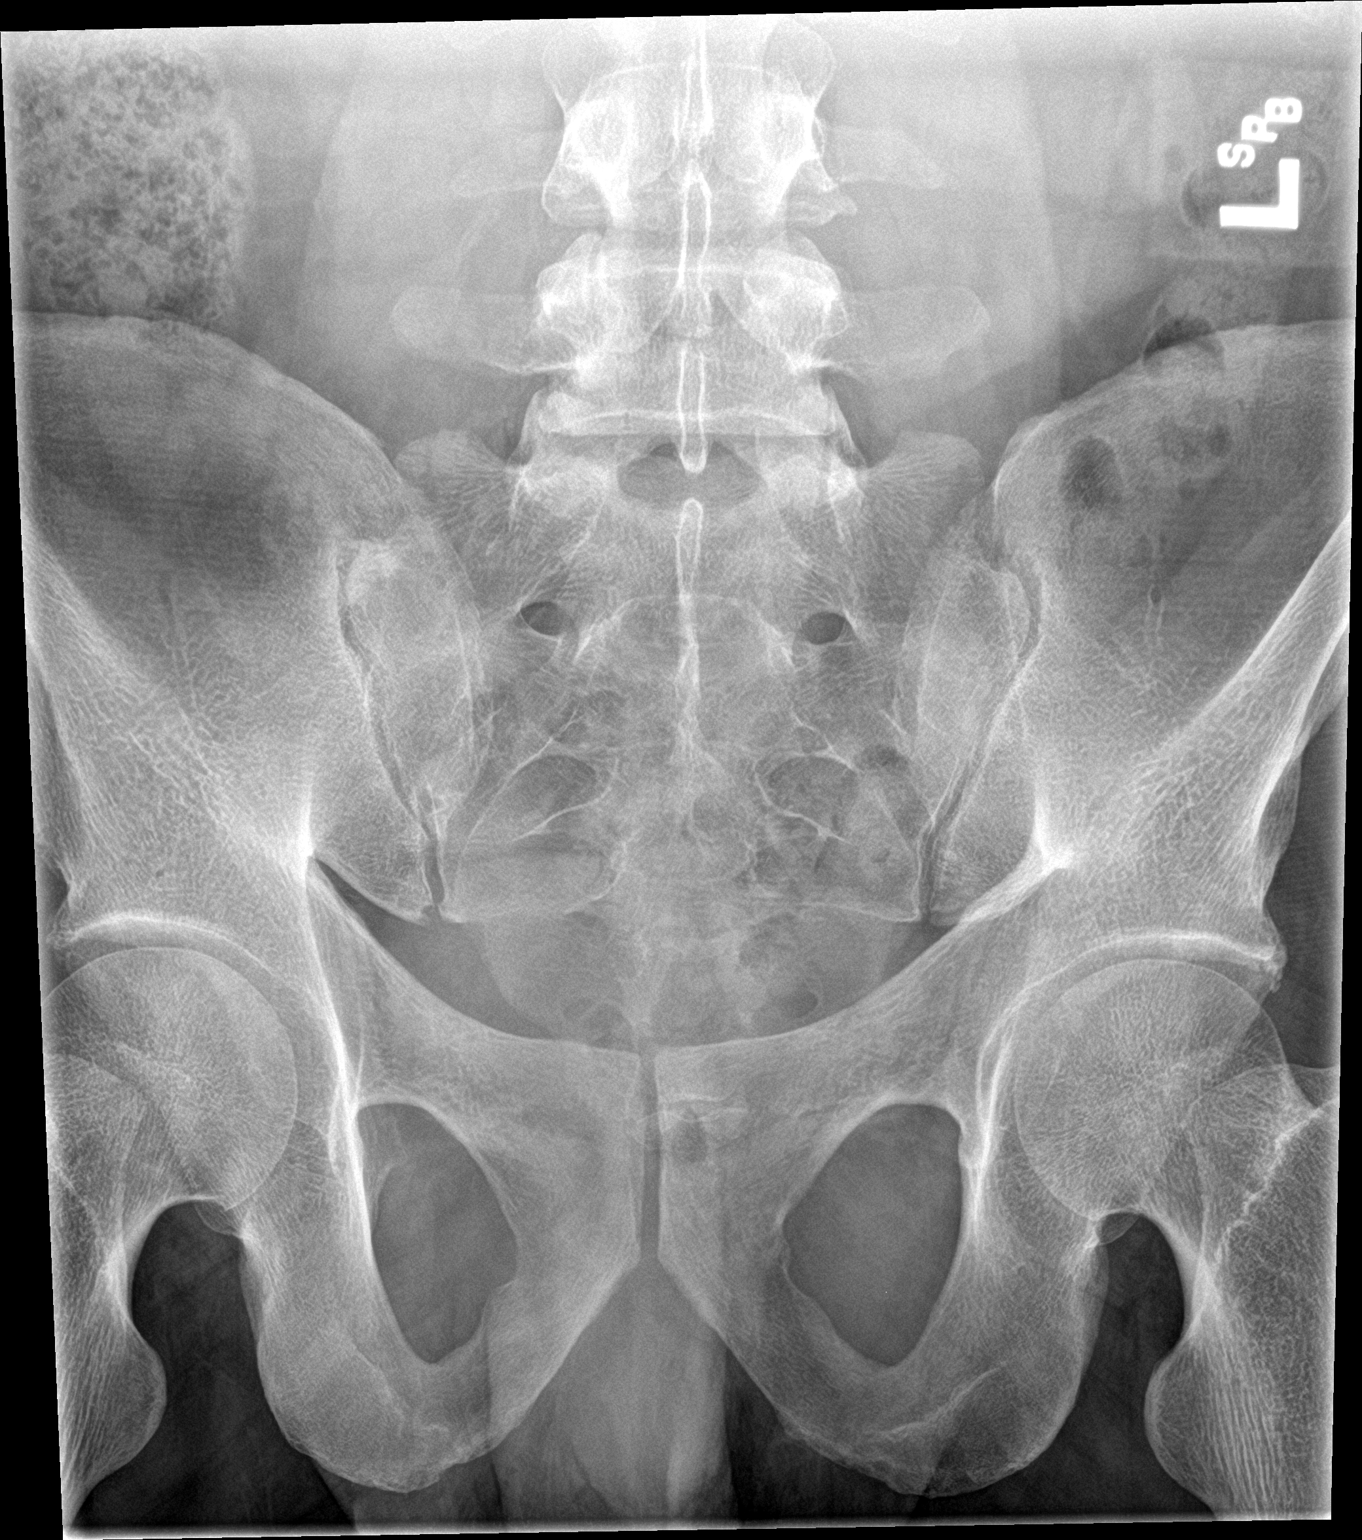

[sacrum lat]
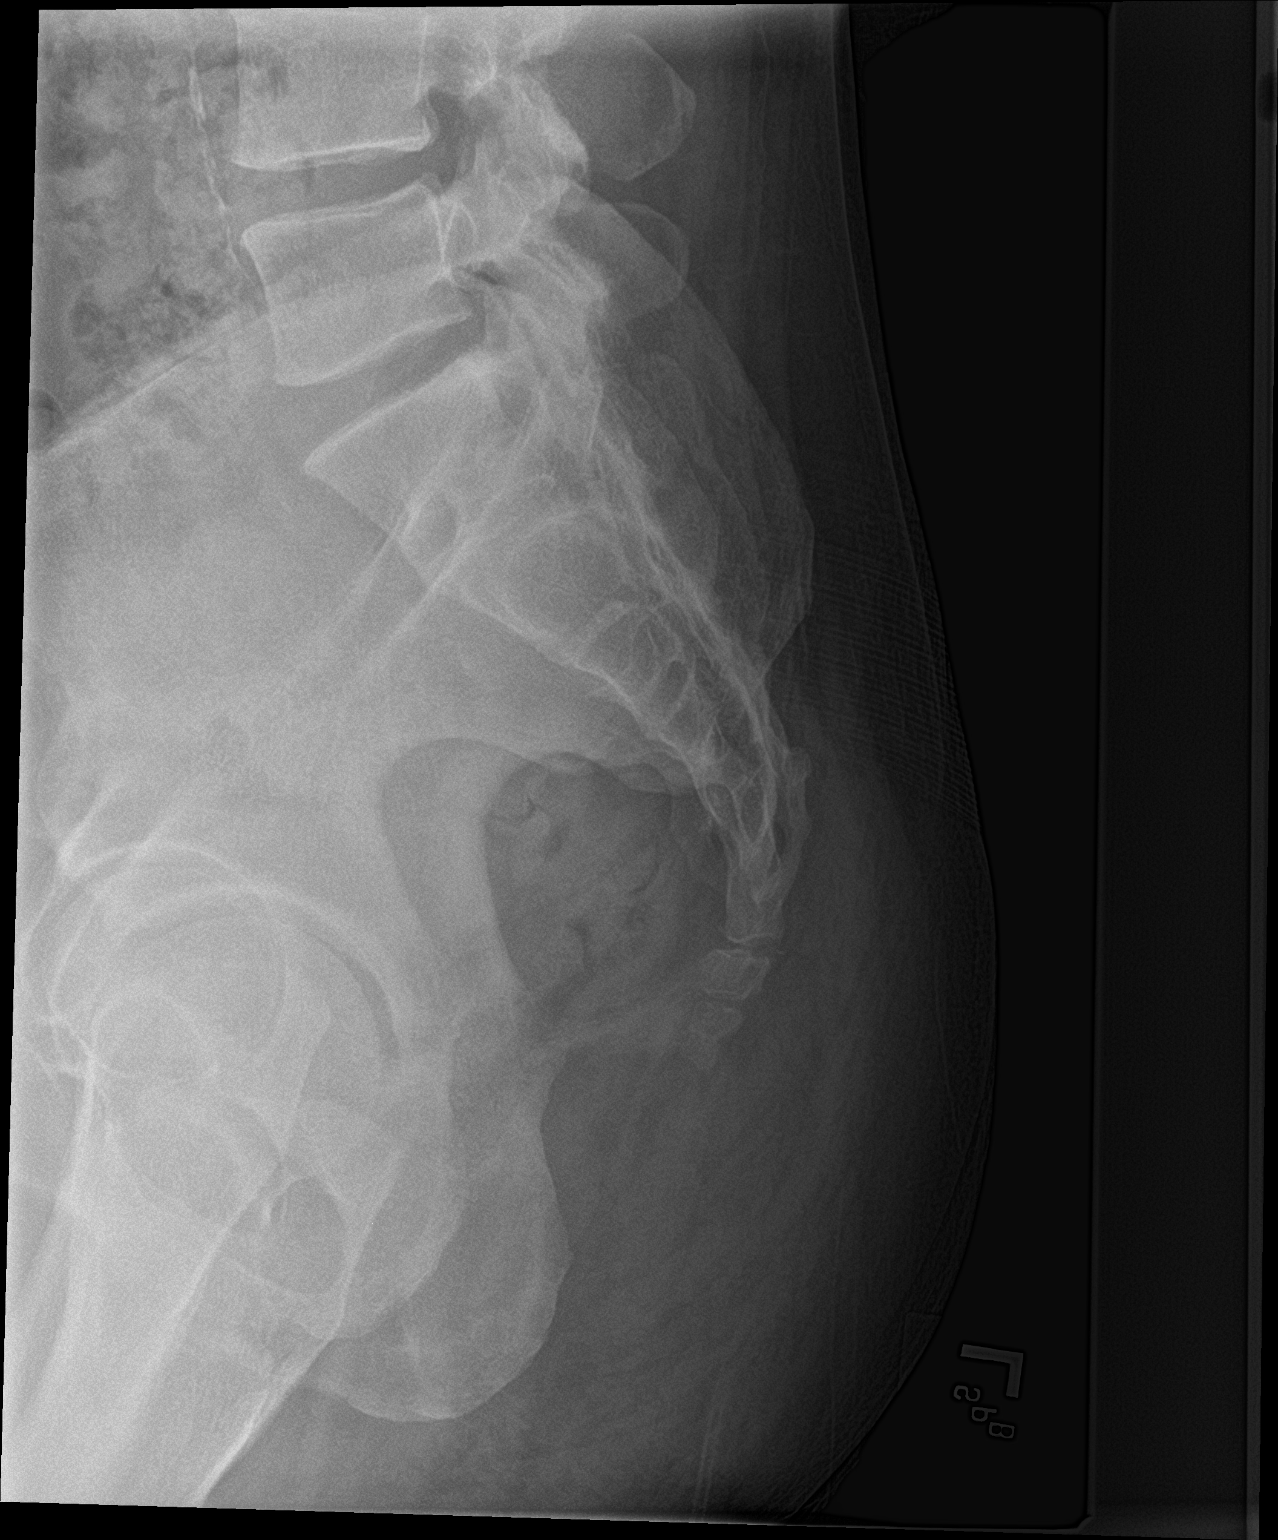

[3 of 3 positions shown; findings below may reference images not displayed]

FINDINGS: The sacrum appears adequately mineralized. There are at least 4
intact sacral struts observed bilaterally. The SI joints are grossly
normal. On the lateral view the sacrococcygeal junction appears
normal. The contour of the sacrum and coccyx is also normal. The
presacral soft tissues appear normal..
IMPRESSION: There is no acute or chronic bony abnormality of the sacrum or
coccyx.

## 2017-09-09 ENCOUNTER — Other Ambulatory Visit: Payer: Self-pay | Admitting: Sports Medicine

## 2017-09-28 ENCOUNTER — Encounter: Payer: Self-pay | Admitting: Sports Medicine

## 2017-09-29 MED ORDER — SCOPOLAMINE 1 MG/3DAYS TD PT72: 1.0000 | MEDICATED_PATCH | TRANSDERMAL | 3 refills | Status: DC

## 2017-09-30 ENCOUNTER — Encounter: Payer: Self-pay | Admitting: Sports Medicine

## 2017-10-04 ENCOUNTER — Encounter: Payer: Self-pay | Admitting: Sports Medicine

## 2017-10-06 ENCOUNTER — Other Ambulatory Visit: Payer: Self-pay | Admitting: Sports Medicine

## 2017-10-06 DIAGNOSIS — E1142 Type 2 diabetes mellitus with diabetic polyneuropathy: Secondary | ICD-10-CM

## 2017-10-17 IMAGING — MR MR ANKLE*L* W/O CM
6 series · 37 of 40 positions shown · non-contrast
Comparison: None.

CLINICAL DATA: Left ankle pain for 9 months.

EXAM:
MRI OF THE LEFT ANKLE WITHOUT CONTRAST
TECHNIQUE: Multiplanar, multisequence MR imaging of the ankle was performed. No
intravenous contrast was administered.

[Series 3: PD fat-sat · axial · 3.0mm · 0.50mm/px · z∈[-117,+15]mm · 7 of 41 slices shown]
[im 1/41]
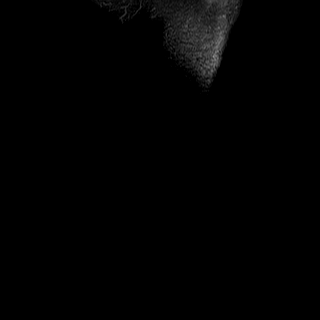
[im 7/41]
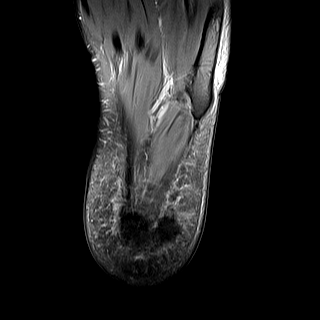
[im 14/41]
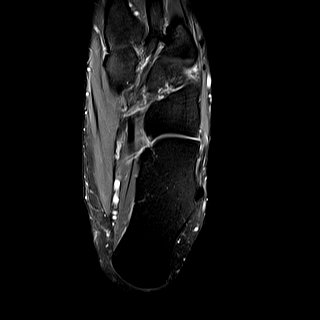
[im 21/41]
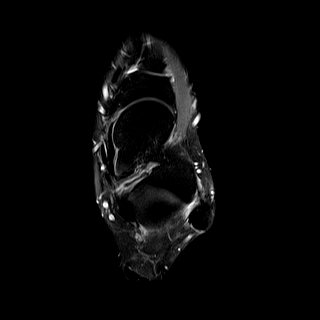
[im 27/41]
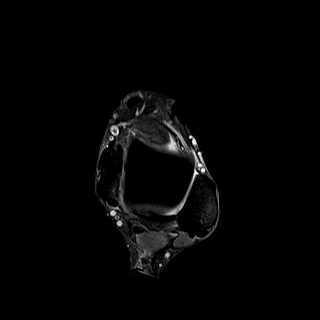
[im 34/41]
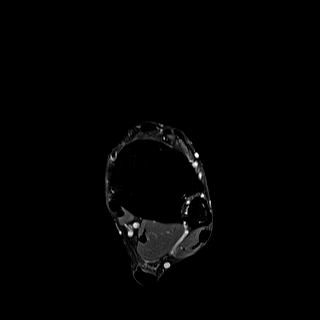
[im 41/41]
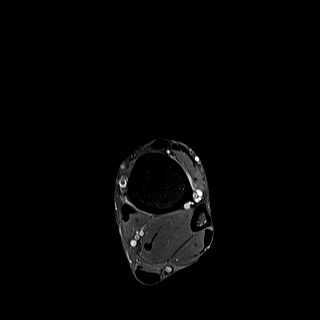

[Series 4: T2 fat-sat · axial · 3.0mm · 0.50mm/px · z∈[-117,+15]mm · 7 of 41 slices shown (1 of 3)]
[im 1/41]
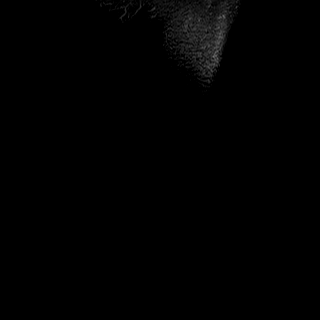
[im 7/41]
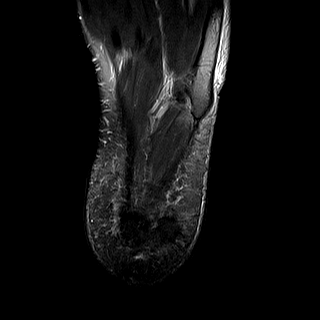
[im 14/41]
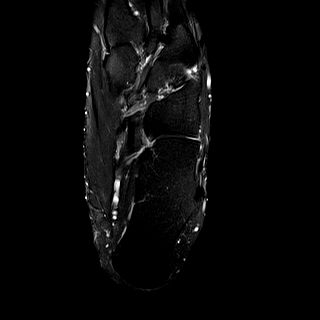
[im 21/41]
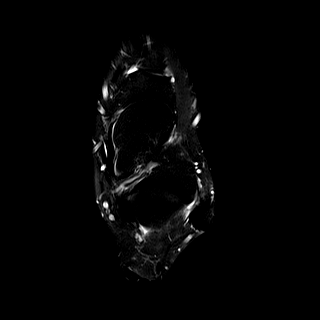
[im 27/41]
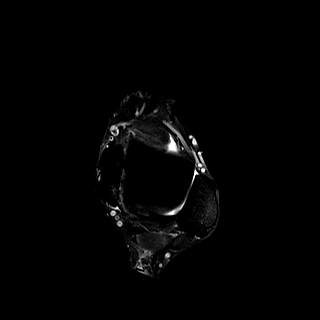
[im 34/41]
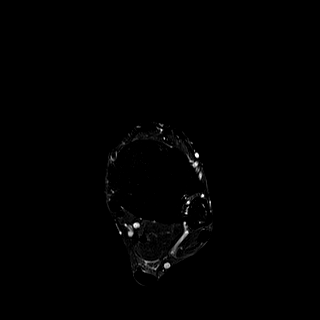
[im 41/41]
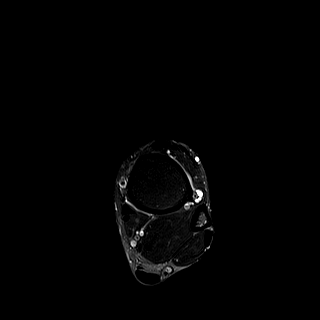

[Series 5: T2 fat-sat · coronal · 3.0mm · 0.56mm/px · 8 of 41 slices shown (2 of 3)]
[im 1/41]
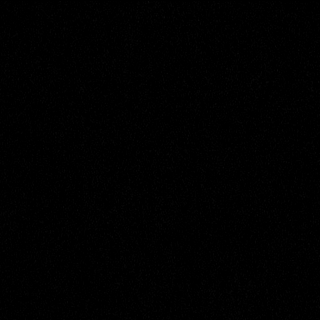
[im 6/41]
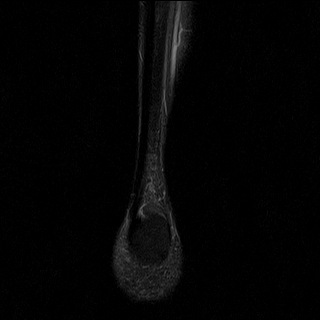
[im 12/41]
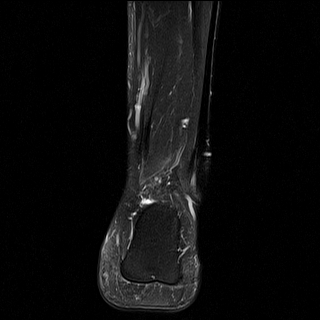
[im 18/41]
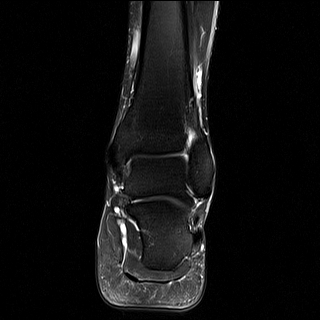
[im 23/41]
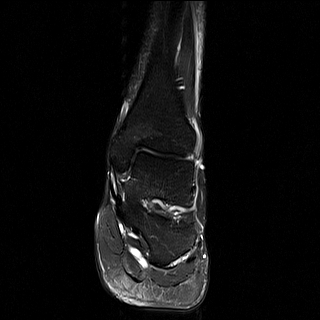
[im 29/41]
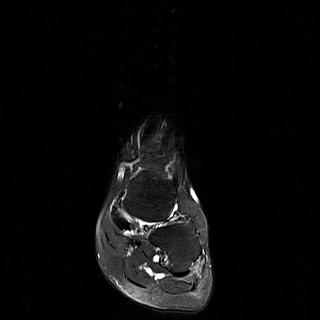
[im 35/41]
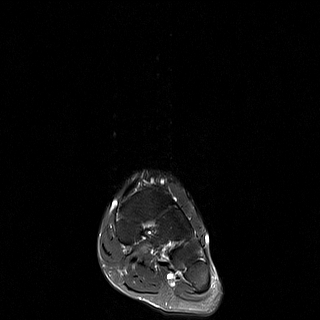
[im 41/41]
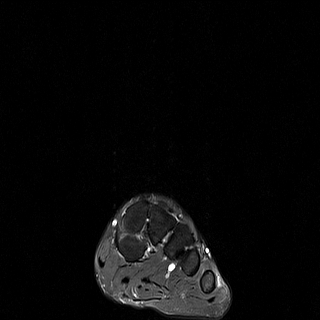

[Series 6: T1 · sagittal · 3.0mm · 0.47mm/px · 5 of 25 slices shown]
[im 1/25]
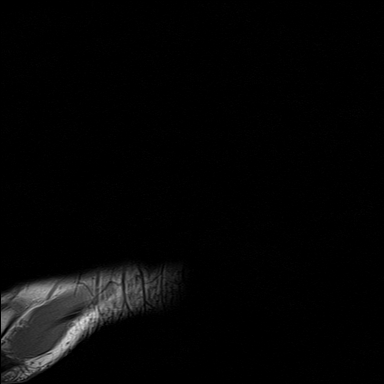
[im 7/25]
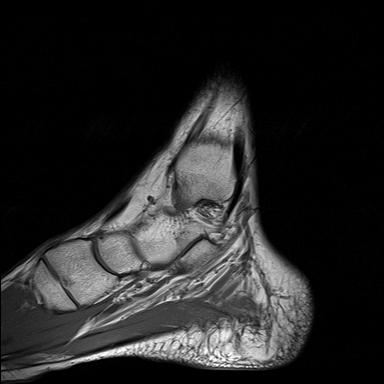
[im 13/25]
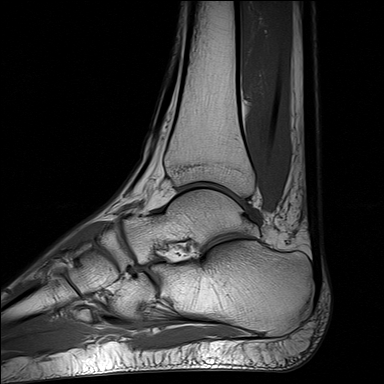
[im 19/25]
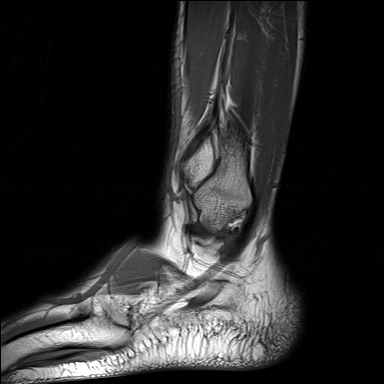
[im 25/25]
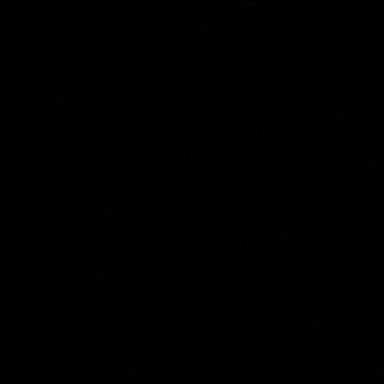

[Series 7: STIR · coronal · 3.0mm · 0.35mm/px · 5 of 41 slices shown]
[im 1/41]
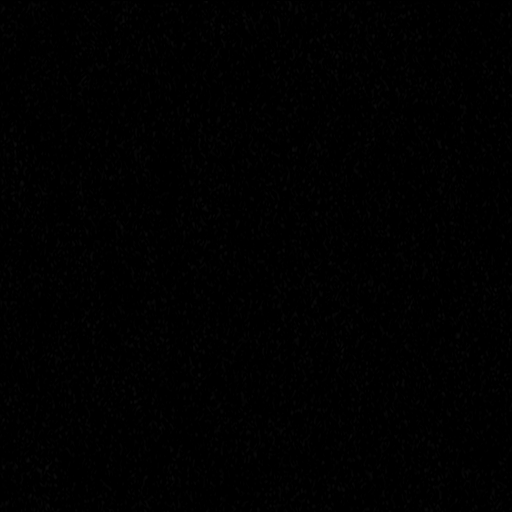
[im 6/41]
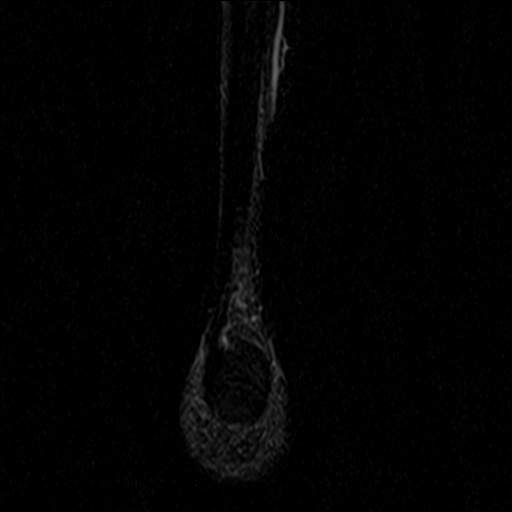
[im 12/41]
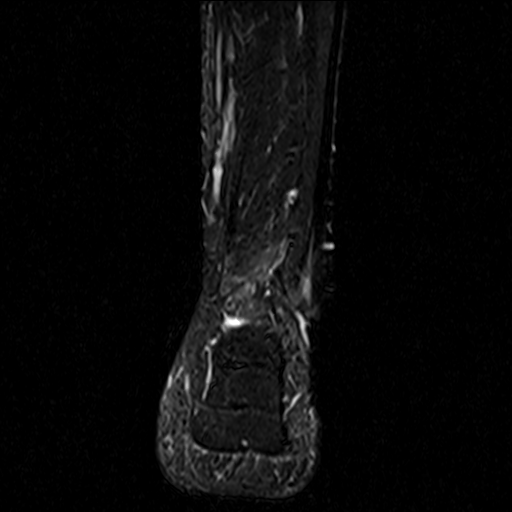
[im 18/41]
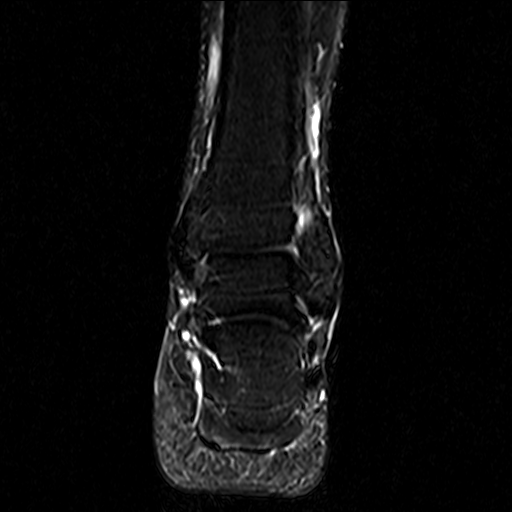
[im 23/41]
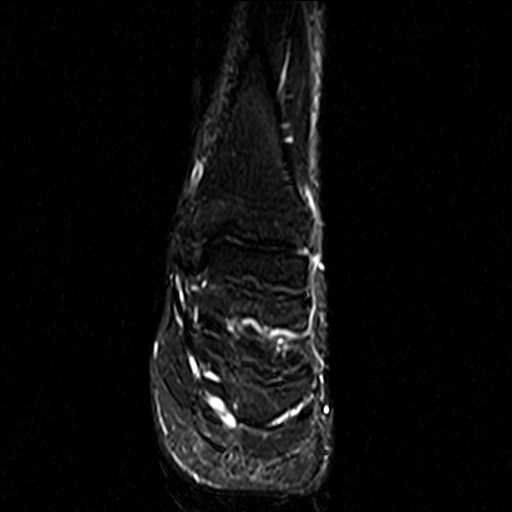

[Series 8: T2 fat-sat · sagittal · 3.0mm · 0.56mm/px · 5 of 25 slices shown (3 of 3)]
[im 1/25]
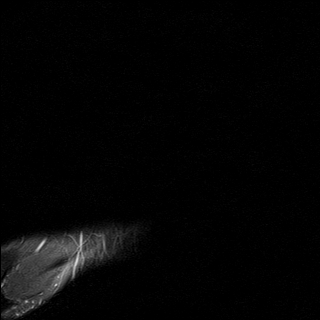
[im 7/25]
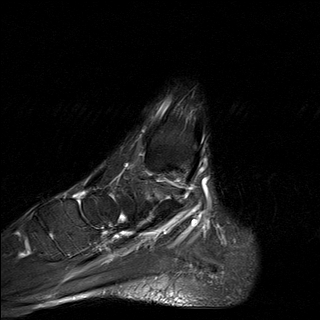
[im 13/25]
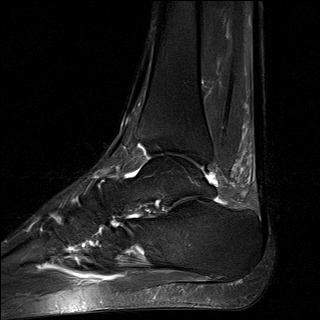
[im 19/25]
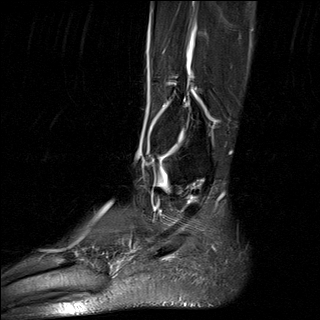
[im 25/25]
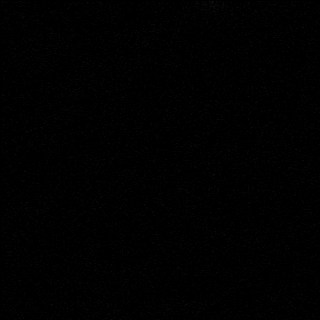

[37 of 40 positions shown; findings below may reference images not displayed]

FINDINGS: TENDONS

Peroneal: Peroneal longus tendon intact. Peroneal brevis intact.

Posteromedial: Posterior tibial tendon intact. Flexor hallucis
longus tendon intact. Flexor digitorum longus tendon intact.

Anterior: Tibialis anterior tendon intact. Extensor hallucis longus
tendon intact Extensor digitorum longus tendon intact.

Achilles:  Intact. Mild edema in Kager's fat.

Plantar Fascia: Intact.

LIGAMENTS

Lateral: Anterior talofibular ligament intact. Posterior talofibular
ligament intact. Anterior and posterior tibiofibular ligaments
intact.

Medial: Deltoid ligament intact. Spring ligament intact.

CARTILAGE

Ankle Joint: No joint effusion. Normal ankle mortise. No chondral
defect.

Subtalar Joints/Sinus Tarsi: Normal subtalar joints. No subtalar
joint effusion. Normal sinus tarsi.

Bones: Limited evaluation of the fifth metatarsal secondary to poor
fat saturation. No marrow signal abnormality. No fracture or
dislocation.

Soft Tissue: No other soft tissue abnormality. No fluid collection
or hematoma.
IMPRESSION: 1. Intact Achilles tendon with mild nonspecific edema in Kager's
fat.

## 2017-10-28 ENCOUNTER — Encounter: Payer: Self-pay | Admitting: Sports Medicine

## 2017-10-29 ENCOUNTER — Other Ambulatory Visit: Payer: Self-pay | Admitting: Sports Medicine

## 2017-10-29 DIAGNOSIS — E1142 Type 2 diabetes mellitus with diabetic polyneuropathy: Secondary | ICD-10-CM

## 2017-11-01 ENCOUNTER — Other Ambulatory Visit: Payer: Self-pay | Admitting: Sports Medicine

## 2017-11-01 MED ORDER — ESOMEPRAZOLE MAGNESIUM 40 MG PO CPDR
DELAYED_RELEASE_CAPSULE | ORAL | 3 refills | Status: DC
Start: 2017-11-01 — End: 2017-11-01

## 2017-11-01 MED ORDER — PANTOPRAZOLE SODIUM 40 MG PO TBEC: 40.0000 mg | DELAYED_RELEASE_TABLET | Freq: Every day | ORAL | 3 refills | Status: DC

## 2017-11-02 ENCOUNTER — Other Ambulatory Visit: Payer: Self-pay | Admitting: Neurology

## 2017-11-02 ENCOUNTER — Other Ambulatory Visit: Payer: Self-pay | Admitting: Sports Medicine

## 2017-11-23 ENCOUNTER — Encounter: Payer: Self-pay | Admitting: Sports Medicine

## 2017-11-29 ENCOUNTER — Other Ambulatory Visit: Payer: Self-pay | Admitting: Sports Medicine

## 2017-11-29 DIAGNOSIS — E1142 Type 2 diabetes mellitus with diabetic polyneuropathy: Secondary | ICD-10-CM

## 2017-12-23 ENCOUNTER — Other Ambulatory Visit: Payer: Self-pay | Admitting: Sports Medicine

## 2017-12-29 ENCOUNTER — Other Ambulatory Visit: Payer: Self-pay | Admitting: Sports Medicine

## 2017-12-29 DIAGNOSIS — E1142 Type 2 diabetes mellitus with diabetic polyneuropathy: Secondary | ICD-10-CM

## 2018-01-12 ENCOUNTER — Ambulatory Visit: Payer: 59 | Admitting: Sports Medicine

## 2018-01-12 DIAGNOSIS — E1142 Type 2 diabetes mellitus with diabetic polyneuropathy: Secondary | ICD-10-CM | POA: Diagnosis not present

## 2018-01-12 DIAGNOSIS — G609 Hereditary and idiopathic neuropathy, unspecified: Secondary | ICD-10-CM | POA: Diagnosis not present

## 2018-01-12 DIAGNOSIS — G629 Polyneuropathy, unspecified: Secondary | ICD-10-CM | POA: Insufficient documentation

## 2018-01-12 MED ORDER — DULOXETINE HCL 30 MG PO CPEP: 30.0000 mg | ORAL_CAPSULE | Freq: Every day | ORAL | 3 refills | Status: DC

## 2018-01-12 NOTE — Progress Notes (Signed)
Subjective:    CC: Feeling tired  HPI: This is a pleasant 47 year old male, over the past 6 days or so he has had increasing fatigue, worsening peripheral neuropathy, as well as irritability, recently had a big fight with his wife.  On further questioning he ran out of his Cymbalta about 1 week ago and has not been taking it.  Vertigo is also a little bit worse.  No suicidal or homicidal ideation.  No upper respiratory or lower respiratory symptoms, no GI symptoms, no skin rash.  No constitutional symptoms.  I reviewed the past medical history, family history, social history, surgical history, and allergies today and no changes were needed.  Please see the problem list section below in epic for further details.  Past Medical History: Past Medical History:  Diagnosis Date  . Diabetes mellitus   . GERD (gastroesophageal reflux disease)   . Hyperlipidemia   . Hypertension   . Neuropathy [facility](HCC)    Past Surgical History: Past Surgical History:  Procedure Laterality Date  . KNEE SURGERY  2005   right   Social History: Social History   Socioeconomic History  . Marital status: Married    Spouse name: Morrie Sheldonshley   . Number of children: 1  . Years of education: 5916  . Highest education level: Not on file  Social Needs  . Financial resource strain: Not on file  . Food insecurity - worry: Not on file  . Food insecurity - inability: Not on file  . Transportation needs - medical: Not on file  . Transportation needs - non-medical: Not on file  Occupational History  . Occupation: Civil engineer, contractingolice Officer    [employer]er: [employer]  Tobacco Use  . Smoking status: Former Smoker    Last attempt to quit: 03/06/1993    Years since quitting: 24.8  . Smokeless tobacco: Never Used  Substance and Sexual Activity  . Alcohol use: Yes    Comment: rarely  . Drug use: No  . Sexual activity: Yes  Other Topics Concern  . Not on file  Social History Narrative   Lives with wife   Caffeine use: Soda- diet   Coffee- rare    Family History: Family History  Problem Relation Age of Onset  . Heart disease Mother   . Diabetes Mother   . Hypertension Mother   . Heart disease Father [id number]       CABG  . Diabetes Sister   . Hypertension Sister    Allergies: Allergies  Allergen Reactions  . Phentermine     Erectile dysfunction on full daily dose as well as one half tab twice a day   Medications: See med rec.  Review of Systems: No fevers, chills, night sweats, weight loss, chest pain, or shortness of breath.   Objective:    General: Well Developed, well nourished, and in no acute distress.  Neuro: Alert and oriented x3, extra-ocular muscles intact, sensation grossly intact.  HEENT: Normocephalic, atraumatic, pupils equal round reactive to light, neck supple, no masses, no lymphadenopathy, thyroid nonpalpable.  Oropharynx, nasopharynx, ear canals unremarkable Skin: Warm and dry, no rashes. Cardiac: Regular rate and rhythm, no murmurs rubs or gallops, no lower extremity edema.  Respiratory: Clear to auscultation bilaterally. Not using accessory muscles, speaking in full sentences.  Impression and Recommendations:    Peripheral neuropathy Doing well until he ran out of Cymbalta a week ago, then about 6 days ago started to have increasing fatigue, vertigo, worsening neuropathy. I do think he simply withdrawing  from Cymbalta, restarting this, I am going to check some labs since fatigue is such a nonspecific symptom.  Diabetes mellitus, type 2 Is been almost a year since we have checked routine labs, doing this now. ___________________________________________ Ihor Austin. Benjamin Stain, M.D., ABFM., CAQSM. Primary Care and Sports Medicine [facility]  Adjunct Instructor of Family Medicine  [employer]

## 2018-01-12 NOTE — Assessment & Plan Note (Signed)
Is been almost a year since we have checked routine labs, doing this now.

## 2018-01-12 NOTE — Assessment & Plan Note (Signed)
Doing well until he ran out of Cymbalta a week ago, then about 6 days ago started to have increasing fatigue, vertigo, worsening neuropathy. I do think he simply withdrawing from Cymbalta, restarting this, I am going to check some labs since fatigue is such a nonspecific symptom.

## 2018-01-13 ENCOUNTER — Encounter: Payer: Self-pay | Admitting: Sports Medicine

## 2018-01-13 LAB — COMPREHENSIVE METABOLIC PANEL WITH GFR
AG Ratio: 1.8 (calc) (ref 1.0–2.5)
ALT: 31 U/L (ref 9–46)
Albumin: 4.7 g/dL (ref 3.6–5.1)
Calcium: 10 mg/dL (ref 8.6–10.3)
Glucose, Bld: 114 mg/dL — ABNORMAL HIGH (ref 65–99)
Potassium: 4.4 mmol/L (ref 3.5–5.3)
Sodium: 138 mmol/L (ref 135–146)
Total Protein: 7.3 g/dL (ref 6.1–8.1)

## 2018-01-13 LAB — LIPID PANEL W/REFLEX DIRECT LDL
Cholesterol: 157 mg/dL (ref <200)
HDL: 43 mg/dL (ref >40)
LDL Cholesterol (Calc): 88 mg/dL (calc)
Non-HDL Cholesterol (Calc): 114 mg/dL (ref <130)
Total CHOL/HDL Ratio: 3.7 (calc) (ref <5.0)
Triglycerides: 159 mg/dL — ABNORMAL HIGH (ref <150)

## 2018-01-13 LAB — HEMOGLOBIN A1C
Hgb A1c MFr Bld: 6.1 %{Hb} — ABNORMAL HIGH (ref <5.7)
Mean Plasma Glucose: 128 (calc)
eAG (mmol/L): 7.1 (calc)

## 2018-01-13 LAB — COMPREHENSIVE METABOLIC PANEL
AST: 24 U/L (ref 10–40)
Alkaline phosphatase (APISO): 67 U/L (ref 40–115)
BUN: 17 mg/dL (ref 7–25)
CO2: 31 mmol/L (ref 20–32)
Chloride: 100 mmol/L (ref 98–110)
Creat: 1.01 mg/dL (ref 0.60–1.35)
Globulin: 2.6 g/dL (calc) (ref 1.9–3.7)
Total Bilirubin: 0.5 mg/dL (ref 0.2–1.2)

## 2018-01-13 LAB — CBC
HCT: 43.6 % (ref 38.5–50.0)
Hemoglobin: 14.4 g/dL (ref 13.2–17.1)
MCH: 27 pg (ref 27.0–33.0)
MCHC: 33 g/dL (ref 32.0–36.0)
MCV: 81.6 fL (ref 80.0–100.0)
MPV: 10.1 fL (ref 7.5–12.5)
Platelets: 256 10*3/uL (ref 140–400)
RBC: 5.34 Million/uL (ref 4.20–5.80)
RDW: 14 % (ref 11.0–15.0)
WBC: 7.7 Thousand/uL (ref 3.8–10.8)

## 2018-01-13 LAB — TSH: TSH: 1.12 m[IU]/L (ref 0.40–4.50)

## 2018-01-19 ENCOUNTER — Other Ambulatory Visit: Payer: Self-pay | Admitting: Sports Medicine

## 2018-01-26 ENCOUNTER — Encounter: Payer: Self-pay | Admitting: Sports Medicine

## 2018-01-27 MED ORDER — EMPAGLIFLOZIN-METFORMIN HCL ER 10-1000 MG PO TB24: 1.0000 | ORAL_TABLET | Freq: Every day | ORAL | 11 refills | Status: DC

## 2018-02-03 ENCOUNTER — Encounter: Payer: Self-pay | Admitting: Sports Medicine

## 2018-02-03 ENCOUNTER — Other Ambulatory Visit: Payer: Self-pay | Admitting: Sports Medicine

## 2018-02-03 DIAGNOSIS — E1142 Type 2 diabetes mellitus with diabetic polyneuropathy: Secondary | ICD-10-CM

## 2018-02-13 ENCOUNTER — Ambulatory Visit: Payer: 59 | Admitting: Sports Medicine

## 2018-02-13 ENCOUNTER — Encounter: Payer: Self-pay | Admitting: Sports Medicine

## 2018-02-13 DIAGNOSIS — G609 Hereditary and idiopathic neuropathy, unspecified: Secondary | ICD-10-CM | POA: Diagnosis not present

## 2018-02-13 DIAGNOSIS — M7711 Lateral epicondylitis, right elbow: Secondary | ICD-10-CM

## 2018-02-13 MED ORDER — DICLOFENAC SODIUM 2 % TD SOLN: 2.0000 | Freq: Two times a day (BID) | TRANSDERMAL | 11 refills | Status: DC

## 2018-02-13 MED ORDER — DULOXETINE HCL 60 MG PO CPEP: 60.0000 mg | ORAL_CAPSULE | Freq: Every day | ORAL | 3 refills | Status: DC

## 2018-02-13 NOTE — Assessment & Plan Note (Signed)
Tennis elbow brace, topical Pennsaid (diclofenac 2% topical), rehab exercises, he needs to discontinue his Dadbod challenge.

## 2018-02-13 NOTE — Assessment & Plan Note (Addendum)
Overall doing well on Cymbalta 30. Increasing to 60 mg. Continue alpha lipoic acid. He is thinking about CBD oil. Because we have not fully evaluated his lumbar spine, if he has persistent symptoms at the follow-up visit we will proceed with a lumbar spine MRI for discussion of an S1 epidural on the right.

## 2018-02-13 NOTE — Progress Notes (Signed)
Subjective:    CC: Right elbow pain  HPI: This is a pleasant 47 year old male, he has been doing an exercise program for the last several months, over the last 3 weeks he is developed pain over his lateral right elbow, severe, persistent, localized with radiation into the dorsal mid forearm.  He stopped the exercise program but pain unfortunately has been persistent.  Peripheral neuropathy: With paresthesias into the fifth toe, improved with Cymbalta, alpha lipoic acid, we did not get much else with the visit with neurology.  He is wondering if we can do an injection.  Only on 30 mg of Cymbalta right now.  I reviewed the past medical history, family history, social history, surgical history, and allergies today and no changes were needed.  Please see the problem list section below in epic for further details.  Past Medical History: Past Medical History:  Diagnosis Date  . Diabetes mellitus   . GERD (gastroesophageal reflux disease)   . Hyperlipidemia   . Hypertension   . Neuropathy    Past Surgical History: Past Surgical History:  Procedure Laterality Date  . KNEE SURGERY  2005   right   Social History: Social History   Socioeconomic History  . Marital status: Married    Spouse name: Morrie Sheldonshley   . Number of children: 1  . Years of education: 2416  . Highest education level: None  Social Needs  . Financial resource strain: None  . Food insecurity - worry: None  . Food insecurity - inability: None  . Transportation needs - medical: None  . Transportation needs - non-medical: None  Occupational History  . Occupation: Civil engineer, contractingolice Officer    [employer]er: [employer]  Tobacco Use  . Smoking status: Former Smoker    Last attempt to quit: 03/06/1993    Years since quitting: 24.9  . Smokeless tobacco: Never Used  Substance and Sexual Activity  . Alcohol use: Yes    Comment: rarely  . Drug use: No  . Sexual activity: Yes  Other Topics Concern  . None  Social History Narrative   Lives with wife   Caffeine use: Soda- diet   Coffee- rare    Family History: Family History  Problem Relation Age of Onset  . Heart disease Mother   . Diabetes Mother   . Hypertension Mother   . Heart disease Father [id number]       CABG  . Diabetes Sister   . Hypertension Sister    Allergies: Allergies  Allergen Reactions  . Phentermine     Erectile dysfunction on full daily dose as well as one half tab twice a day   Medications: See med rec.  Review of Systems: No fevers, chills, night sweats, weight loss, chest pain, or shortness of breath.   Objective:    General: Well Developed, well nourished, and in no acute distress.  Neuro: Alert and oriented x3, extra-ocular muscles intact, sensation grossly intact.  HEENT: Normocephalic, atraumatic, pupils equal round reactive to light, neck supple, no masses, no lymphadenopathy, thyroid nonpalpable.  Skin: Warm and dry, no rashes. Cardiac: Regular rate and rhythm, no murmurs rubs or gallops, no lower extremity edema.  Respiratory: Clear to auscultation bilaterally. Not using accessory muscles, speaking in full sentences. Right elbow: Unremarkable to inspection. Range of motion full pronation, supination, flexion, extension. Strength is full to all of the above directions Stable to varus, valgus stress. Negative moving valgus stress test. Exquisitely tender at the common extensor tendon origin Ulnar nerve does not  sublux. Negative cubital tunnel Tinel's.  Impression and Recommendations:    Right lateral epicondylitis Tennis elbow brace, topical Pennsaid (diclofenac 2% topical), rehab exercises, he needs to discontinue his Dadbod challenge.   Peripheral neuropathy Overall doing well on Cymbalta 30. Increasing to 60 mg. Continue alpha lipoic acid. He is thinking about CBD oil. Because we have not fully evaluated his lumbar spine, if he has persistent symptoms at the follow-up visit we will proceed with a lumbar spine MRI for  discussion of an S1 epidural on the right.  ___________________________________________ Ihor Austin. Benjamin Stain, M.D., ABFM., CAQSM. Primary Care and Sports Medicine [place]  Adjunct Instructor of Family Medicine  [employer]

## 2018-02-14 ENCOUNTER — Encounter: Payer: Self-pay | Admitting: Sports Medicine

## 2018-02-21 ENCOUNTER — Other Ambulatory Visit: Payer: Self-pay | Admitting: Sports Medicine

## 2018-02-21 DIAGNOSIS — E1142 Type 2 diabetes mellitus with diabetic polyneuropathy: Secondary | ICD-10-CM

## 2018-02-21 MED ORDER — GLIPIZIDE 10 MG PO TABS: 10.0000 mg | ORAL_TABLET | Freq: Two times a day (BID) | ORAL | 3 refills | Status: DC

## 2018-02-22 ENCOUNTER — Other Ambulatory Visit: Payer: Self-pay | Admitting: Sports Medicine

## 2018-03-05 ENCOUNTER — Encounter: Payer: Self-pay | Admitting: Sports Medicine

## 2018-03-07 ENCOUNTER — Other Ambulatory Visit: Payer: Self-pay

## 2018-03-07 DIAGNOSIS — G609 Hereditary and idiopathic neuropathy, unspecified: Secondary | ICD-10-CM

## 2018-03-07 MED ORDER — DULOXETINE HCL 60 MG PO CPEP: 60.0000 mg | ORAL_CAPSULE | Freq: Every day | ORAL | 1 refills | Status: DC

## 2018-03-09 ENCOUNTER — Ambulatory Visit (INDEPENDENT_AMBULATORY_CARE_PROVIDER_SITE_OTHER): Payer: 59

## 2018-03-09 ENCOUNTER — Ambulatory Visit: Payer: 59 | Admitting: Sports Medicine

## 2018-03-09 ENCOUNTER — Encounter: Payer: Self-pay | Admitting: Sports Medicine

## 2018-03-09 DIAGNOSIS — S8992XA Unspecified injury of left lower leg, initial encounter: Secondary | ICD-10-CM

## 2018-03-09 DIAGNOSIS — M7711 Lateral epicondylitis, right elbow: Secondary | ICD-10-CM | POA: Diagnosis not present

## 2018-03-09 DIAGNOSIS — W138XXA Fall from, out of or through other building or structure, initial encounter: Secondary | ICD-10-CM | POA: Diagnosis not present

## 2018-03-09 MED ORDER — TRAMADOL HCL 50 MG PO TABS: 50.0000 mg | ORAL_TABLET | Freq: Three times a day (TID) | ORAL | 0 refills | Status: DC | PRN

## 2018-03-09 MED ORDER — DOXYCYCLINE HYCLATE 100 MG PO TABS: 100.0000 mg | ORAL_TABLET | Freq: Two times a day (BID) | ORAL | 0 refills | Status: AC

## 2018-03-09 NOTE — Assessment & Plan Note (Signed)
Persistence of symptoms, common extensor tendon origin injection as above, return in 1 month for this.

## 2018-03-09 NOTE — Progress Notes (Signed)
Subjective:    CC: Follow-up  HPI: Right elbow pain: Tennis elbow, did not respond to conservative measures.  Pain is severe, persistent, localized without radiation.  Left leg injury: After a recent fall, he has had some drainage, swelling, severe pain around the entire proximal tibia.  No fevers or chills.  I reviewed the past medical history, family history, social history, surgical history, and allergies today and no changes were needed.  Please see the problem list section below in epic for further details.  Past Medical History: Past Medical History:  Diagnosis Date  . Diabetes mellitus   . GERD (gastroesophageal reflux disease)   . Hyperlipidemia   . Hypertension   . Neuropathy    Past Surgical History: Past Surgical History:  Procedure Laterality Date  . KNEE SURGERY  2005   right   Social History: Social History   Socioeconomic History  . Marital status: Married    Spouse name: Morrie Sheldonshley   . Number of children: 1  . Years of education: 6716  . Highest education level: Not on file  Occupational History  . Occupation: Civil engineer, contractingolice Officer    [employer]er: [employer]  Social Needs  . Financial resource strain: Not on file  . Food insecurity:    Worry: Not on file    Inability: Not on file  . Transportation needs:    Medical: Not on file    Non-medical: Not on file  Tobacco Use  . Smoking status: Former Smoker    Last attempt to quit: 03/06/1993    Years since quitting: 25.0  . Smokeless tobacco: Never Used  Substance and Sexual Activity  . Alcohol use: Yes    Comment: rarely  . Drug use: No  . Sexual activity: Yes  Lifestyle  . Physical activity:    Days per week: Not on file    Minutes per session: Not on file  . Stress: Not on file  Relationships  . Social connections:    Talks on phone: Not on file    Gets together: Not on file    Attends religious service: Not on file    Active member of club or organization: Not on file    Attends meetings of clubs  or organizations: Not on file    Relationship status: Not on file  Other Topics Concern  . Not on file  Social History Narrative   Lives with wife   Caffeine use: Soda- diet   Coffee- rare    Family History: Family History  Problem Relation Age of Onset  . Heart disease Mother   . Diabetes Mother   . Hypertension Mother   . Heart disease Father [id number]       CABG  . Diabetes Sister   . Hypertension Sister    Allergies: Allergies  Allergen Reactions  . Phentermine     Erectile dysfunction on full daily dose as well as one half tab twice a day   Medications: See med rec.  Review of Systems: No fevers, chills, night sweats, weight loss, chest pain, or shortness of breath.   Objective:    General: Well Developed, well nourished, and in no acute distress.  Neuro: Alert and oriented x3, extra-ocular muscles intact, sensation grossly intact.  HEENT: Normocephalic, atraumatic, pupils equal round reactive to light, neck supple, no masses, no lymphadenopathy, thyroid nonpalpable.  Skin: Warm and dry, no rashes. Cardiac: Regular rate and rhythm, no murmurs rubs or gallops, no lower extremity edema.  Respiratory: Clear to auscultation  bilaterally. Not using accessory muscles, speaking in full sentences. Right elbow: Unremarkable to inspection. Range of motion full pronation, supination, flexion, extension. Strength is full to all of the above directions Stable to varus, valgus stress. Negative moving valgus stress test. Severe tenderness at the common extensor tendon origin Ulnar nerve does not sublux. Negative cubital tunnel Tinel's. Left leg: Subcutaneous hematoma palpable, there is an abrasion with mild erythema, warmth and yellowish discharge on the Band-Aid.  There is also severe tenderness around the posterior medial aspect of the proximal tibia.  Procedure: Real-time Ultrasound Guided Injection of right common extensor tendon origin Device: GE Logiq E  Verbal informed consent  obtained.  Time-out conducted.  Noted no overlying erythema, induration, or other signs of local infection.  Skin prepped in a sterile fashion.  Local anesthesia: Topical Ethyl chloride.  With sterile technique and under real time ultrasound guidance: Noted hypoechoic gap in the proximal origin of the common extensor tendon, 25-gauge needle advanced in medication injected both superficial to and deep to the common extensor tendon for a total of 1 cc Kenalog 40, 1 cc lidocaine, 1 cc bupivacaine. Completed without difficulty  Pain immediately resolved suggesting accurate placement of the medication.  Advised to call if fevers/chills, erythema, induration, drainage, or persistent bleeding.  Images permanently stored and available for review in the ultrasound unit.  Impression: Technically successful ultrasound guided injection.  Impression and Recommendations:    Right lateral epicondylitis Persistence of symptoms, common extensor tendon origin injection as above, return in 1 month for this.  Left leg injury Dressed, keep antibiotic ointment and gauze over top. Tibial shaft x-rays. Tramadol for pain. Adding some doxycycline, there was some warmth, erythema, yellowish discharge on the Band-Aid. ___________________________________________ Ihor Austin. Benjamin Stain, M.D., ABFM., CAQSM. Primary Care and Sports Medicine [place] [facility]  Adjunct Instructor of Family Medicine  [employer]

## 2018-03-09 NOTE — Assessment & Plan Note (Signed)
Dressed, keep antibiotic ointment and gauze over top. Tibial shaft x-rays. Tramadol for pain. Adding some doxycycline, there was some warmth, erythema, yellowish discharge on the Band-Aid.

## 2018-03-19 ENCOUNTER — Encounter: Payer: Self-pay | Admitting: Sports Medicine

## 2018-04-03 ENCOUNTER — Other Ambulatory Visit: Payer: Self-pay | Admitting: Sports Medicine

## 2018-04-03 DIAGNOSIS — E785 Hyperlipidemia, unspecified: Secondary | ICD-10-CM

## 2018-05-14 ENCOUNTER — Other Ambulatory Visit: Payer: Self-pay | Admitting: Sports Medicine

## 2018-05-14 DIAGNOSIS — G609 Hereditary and idiopathic neuropathy, unspecified: Secondary | ICD-10-CM

## 2018-05-15 ENCOUNTER — Ambulatory Visit (INDEPENDENT_AMBULATORY_CARE_PROVIDER_SITE_OTHER): Payer: 59 | Admitting: Sports Medicine

## 2018-05-15 ENCOUNTER — Encounter: Payer: Self-pay | Admitting: Sports Medicine

## 2018-05-15 DIAGNOSIS — S61210A Laceration without foreign body of right index finger without damage to nail, initial encounter: Secondary | ICD-10-CM | POA: Insufficient documentation

## 2018-05-15 MED ORDER — CEPHALEXIN 500 MG PO CAPS: 500.0000 mg | ORAL_CAPSULE | Freq: Two times a day (BID) | ORAL | 0 refills | Status: DC

## 2018-05-15 NOTE — Assessment & Plan Note (Signed)
Bleeding overnight. Adding Keflex twice a day for 5 days, laceration closed with Dermabond. Return as needed.

## 2018-05-15 NOTE — Progress Notes (Signed)
Subjective:    CC: Finger laceration  HPI: This is a pleasant 47 year old male, he was trying to use his ninja smoothie maker, but unfortunately sliced his right index finger.  Its continued bleeding for an entire day now.  Only minimal pain.  No pulsatile bleeding.  I reviewed the past medical history, family history, social history, surgical history, and allergies today and no changes were needed.  Please see the problem list section below in epic for further details.  Past Medical History: Past Medical History:  Diagnosis Date  . Diabetes mellitus   . GERD (gastroesophageal reflux disease)   . Hyperlipidemia   . Hypertension   . Neuropathy    Past Surgical History: Past Surgical History:  Procedure Laterality Date  . KNEE SURGERY  2005   right   Social History: Social History   Socioeconomic History  . Marital status: Married    Spouse name: Morrie Sheldonshley   . Number of children: 1  . Years of education: 716  . Highest education level: Not on file  Occupational History  . Occupation: Civil engineer, contractingolice Officer    [employer]er: [employer]  Social Needs  . Financial resource strain: Not on file  . Food insecurity:    Worry: Not on file    Inability: Not on file  . Transportation needs:    Medical: Not on file    Non-medical: Not on file  Tobacco Use  . Smoking status: Former Smoker    Last attempt to quit: 03/06/1993    Years since quitting: 25.2  . Smokeless tobacco: Never Used  Substance and Sexual Activity  . Alcohol use: Yes    Comment: rarely  . Drug use: No  . Sexual activity: Yes  Lifestyle  . Physical activity:    Days per week: Not on file    Minutes per session: Not on file  . Stress: Not on file  Relationships  . Social connections:    Talks on phone: Not on file    Gets together: Not on file    Attends religious service: Not on file    Active member of club or organization: Not on file    Attends meetings of clubs or organizations: Not on file   Relationship status: Not on file  Other Topics Concern  . Not on file  Social History Narrative   Lives with wife   Caffeine use: Soda- diet   Coffee- rare    Family History: Family History  Problem Relation Age of Onset  . Heart disease Mother   . Diabetes Mother   . Hypertension Mother   . Heart disease Father [id number]       CABG  . Diabetes Sister   . Hypertension Sister    Allergies: Allergies  Allergen Reactions  . Phentermine     Erectile dysfunction on full daily dose as well as one half tab twice a day   Medications: See med rec.  Review of Systems: No fevers, chills, night sweats, weight loss, chest pain, or shortness of breath.   Objective:    General: Well Developed, well nourished, and in no acute distress.  Neuro: Alert and oriented x3, extra-ocular muscles intact, sensation grossly intact.  HEENT: Normocephalic, atraumatic, pupils equal round reactive to light, neck supple, no masses, no lymphadenopathy, thyroid nonpalpable.  Skin: Warm and dry, no rashes. Cardiac: Regular rate and rhythm, no murmurs rubs or gallops, no lower extremity edema.  Respiratory: Clear to auscultation bilaterally. Not using accessory muscles, speaking in  full sentences. Right hand: There is a 2cm laceration across the pad of the index finger, hemostatic, good motion, good strength, neurovascularly intact.  Good capillary refill.  Laceration repair: Indication: bleeding Location: Right index finger Size: 2 cm Anesthesia: None needed Dermabond applied, hemostasis achieved as well as good approximation of the wound edges Tolerated well Routine postprocedure instructions d/w pt- keep area clean and bandaged, follow up if concerns/spreading erythema/pain.    Impression and Recommendations:    Laceration of index finger of right hand without complication Bleeding overnight. Adding Keflex twice a day for 5 days, laceration closed with Dermabond. Return as  needed. ___________________________________________ Ihor Austin. Benjamin Stain, M.D., ABFM., CAQSM. Primary Care and Sports Medicine [facility]  Adjunct Instructor of Family Medicine  [employer]

## 2018-06-19 ENCOUNTER — Encounter: Payer: Self-pay | Admitting: Sports Medicine

## 2018-09-24 ENCOUNTER — Other Ambulatory Visit: Payer: Self-pay | Admitting: Sports Medicine

## 2018-11-05 ENCOUNTER — Other Ambulatory Visit: Payer: Self-pay | Admitting: Sports Medicine

## 2018-11-05 DIAGNOSIS — G609 Hereditary and idiopathic neuropathy, unspecified: Secondary | ICD-10-CM

## 2018-12-06 ENCOUNTER — Encounter: Payer: Self-pay | Admitting: Sports Medicine

## 2018-12-07 NOTE — Telephone Encounter (Signed)
Please look into this when you have time.

## 2018-12-08 ENCOUNTER — Encounter: Payer: Self-pay | Admitting: Sports Medicine

## 2018-12-08 NOTE — Telephone Encounter (Signed)
Approvedtoday ( Jentadueto XR 5-1000MG  er tablets  Effective from 12/08/2018 through 11/28/2038. Pharmacy aware.

## 2018-12-27 ENCOUNTER — Encounter: Payer: 59 | Admitting: Sports Medicine

## 2018-12-29 ENCOUNTER — Ambulatory Visit (INDEPENDENT_AMBULATORY_CARE_PROVIDER_SITE_OTHER): Payer: BLUE CROSS/BLUE SHIELD | Admitting: Sports Medicine

## 2018-12-29 ENCOUNTER — Encounter: Payer: Self-pay | Admitting: Sports Medicine

## 2018-12-29 DIAGNOSIS — H6122 Impacted cerumen, left ear: Secondary | ICD-10-CM | POA: Diagnosis not present

## 2018-12-29 DIAGNOSIS — G609 Hereditary and idiopathic neuropathy, unspecified: Secondary | ICD-10-CM

## 2018-12-29 DIAGNOSIS — E1142 Type 2 diabetes mellitus with diabetic polyneuropathy: Secondary | ICD-10-CM | POA: Diagnosis not present

## 2018-12-29 DIAGNOSIS — M25572 Pain in left ankle and joints of left foot: Secondary | ICD-10-CM

## 2018-12-29 DIAGNOSIS — I10 Essential (primary) hypertension: Secondary | ICD-10-CM | POA: Diagnosis not present

## 2018-12-29 DIAGNOSIS — G8929 Other chronic pain: Secondary | ICD-10-CM

## 2018-12-29 DIAGNOSIS — E785 Hyperlipidemia, unspecified: Secondary | ICD-10-CM

## 2018-12-29 NOTE — Assessment & Plan Note (Signed)
Recheck hemoglobin A1c, continue current medications for now. He did have his eye exam, we are awaiting the report

## 2018-12-29 NOTE — Assessment & Plan Note (Addendum)
Rechecking lipids. 

## 2018-12-29 NOTE — Patient Instructions (Addendum)
Look into orthotics codes: L3030, [id number], [id number].

## 2018-12-29 NOTE — Assessment & Plan Note (Addendum)
Removal with instrumentation.

## 2018-12-29 NOTE — Assessment & Plan Note (Signed)
Well controlled, no changes 

## 2018-12-29 NOTE — Assessment & Plan Note (Signed)
Return for new set of custom orthotics.

## 2018-12-29 NOTE — Progress Notes (Signed)
Subjective:    CC: Follow-up  HPI: Diabetes: Well controlled.  Hypertension: Well-controlled.  Hyperlipidemia: Stable and well-controlled.  Left foot peripheral neuropathy: Stable dorsal forefoot paresthesias.  Hearing loss: There is some loss of hearing in the left ear, he feels as though he is plugged with wax.  I reviewed the past medical history, family history, social history, surgical history, and allergies today and no changes were needed.  Please see the problem list section below in epic for further details.  Past Medical History: Past Medical History:  Diagnosis Date  . Diabetes mellitus   . GERD (gastroesophageal reflux disease)   . Hyperlipidemia   . Hypertension   . Neuropathy    Past Surgical History: Past Surgical History:  Procedure Laterality Date  . KNEE SURGERY  2005   right   Social History: Social History   Socioeconomic History  . Marital status: Married    Spouse name: Morrie Sheldonshley   . Number of children: 1  . Years of education: 5816  . Highest education level: Not on file  Occupational History  . Occupation: Civil engineer, contractingolice Officer    Employer: [employer]  Social Needs  . Financial resource strain: Not on file  . Food insecurity:    Worry: Not on file    Inability: Not on file  . Transportation needs:    Medical: Not on file    Non-medical: Not on file  Tobacco Use  . Smoking status: Former Smoker    Last attempt to quit: 03/06/1993    Years since quitting: 25.8  . Smokeless tobacco: Never Used  Substance and Sexual Activity  . Alcohol use: Yes    Comment: rarely  . Drug use: No  . Sexual activity: Yes  Lifestyle  . Physical activity:    Days per week: Not on file    Minutes per session: Not on file  . Stress: Not on file  Relationships  . Social connections:    Talks on phone: Not on file    Gets together: Not on file    Attends religious service: Not on file    Active member of club or organization: Not on file    Attends  meetings of clubs or organizations: Not on file    Relationship status: Not on file  Other Topics Concern  . Not on file  Social History Narrative   Lives with wife   Caffeine use: Soda- diet   Coffee- rare    Family History: Family History  Problem Relation Age of Onset  . Heart disease Mother   . Diabetes Mother   . Hypertension Mother   . Heart disease Father [id number]       CABG  . Diabetes Sister   . Hypertension Sister    Allergies: Allergies  Allergen Reactions  . Phentermine     Erectile dysfunction on full daily dose as well as one half tab twice a day   Medications: See med rec.  Review of Systems: No fevers, chills, night sweats, weight loss, chest pain, or shortness of breath.   Objective:    General: Well Developed, well nourished, and in no acute distress.  Neuro: Alert and oriented x3, extra-ocular muscles intact, sensation grossly intact.  HEENT: Normocephalic, atraumatic, pupils equal round reactive to light, neck supple, no masses, no lymphadenopathy, thyroid nonpalpable.  Oropharynx, nasopharynx unremarkable, left canal is plugged with cerumen Skin: Warm and dry, no rashes. Cardiac: Regular rate and rhythm, no murmurs rubs or gallops, no lower extremity  edema.  Respiratory: Clear to auscultation bilaterally. Not using accessory muscles, speaking in full sentences.  Indication: Cerumen impaction of the left ear(s) Medical necessity statement: On physical examination, cerumen impairs clinically significant portions of the external auditory canal, and tympanic membrane. Noted obstructive, copious cerumen that cannot be removed without magnification and instrumentations requiring physician skills Consent: Discussed benefits and risks of procedure and verbal consent obtained Procedure: Patient was prepped for the procedure. Utilized an otoscope to assess and take note of the ear canal, the tympanic membrane, and the presence, amount, and placement of the cerumen. Soft  plastic curette was utilized to remove cerumen.  Post procedure examination: shows cerumen was completely removed. Patient tolerated procedure well. The patient is made aware that they may experience temporary vertigo, temporary hearing loss, and temporary discomfort. If these symptom last for more than 24 hours to call the clinic or proceed to the ED.  Impression and Recommendations:    Peripheral neuropathy Overall moderately well controlled on Cymbalta 60, alpha lipoic acid. He was having some increased irritability, I did suggest that we go up to 90 of Cymbalta and add behavioral therapy, he is going to think about it.  Hyperlipidemia with target LDL less than 100 Rechecking lipids.  Essential hypertension, benign Well-controlled, no changes.  Diabetes mellitus, type 2 Recheck hemoglobin A1c, continue current medications for now. He did have his eye exam, we are awaiting the report  Hearing loss due to cerumen impaction, left Removal with instrumentation.  Left ankle pain Return for new set of custom orthotics.  ___________________________________________ Ihor Austin. Benjamin Stain, M.D., ABFM., CAQSM. Primary Care and Sports Medicine [facility]  Adjunct Professor of Family Medicine  [employer]

## 2018-12-29 NOTE — Assessment & Plan Note (Signed)
Overall moderately well controlled on Cymbalta 60, alpha lipoic acid. He was having some increased irritability, I did suggest that we go up to 90 of Cymbalta and add behavioral therapy, he is going to think about it.

## 2018-12-30 LAB — CBC
HCT: 44.4 % (ref 38.5–50.0)
Hemoglobin: 14.3 g/dL (ref 13.2–17.1)
MCH: 26 pg — ABNORMAL LOW (ref 27.0–33.0)
MCHC: 32.2 g/dL (ref 32.0–36.0)
MCV: 80.7 fL (ref 80.0–100.0)
MPV: 10.2 fL (ref 7.5–12.5)
Platelets: 291 10*3/uL (ref 140–400)
RBC: 5.5 10*6/uL (ref 4.20–5.80)
RDW: 14 % (ref 11.0–15.0)
WBC: 6.9 10*3/uL (ref 3.8–10.8)

## 2018-12-30 LAB — COMPREHENSIVE METABOLIC PANEL
AG Ratio: 1.9 (calc) (ref 1.0–2.5)
ALT: 41 U/L (ref 9–46)
AST: 30 U/L (ref 10–40)
Albumin: 4.8 g/dL (ref 3.6–5.1)
Alkaline phosphatase (APISO): 72 U/L (ref 40–115)
CO2: 32 mmol/L (ref 20–32)
Calcium: 10.1 mg/dL (ref 8.6–10.3)
Chloride: 102 mmol/L (ref 98–110)
Globulin: 2.5 g/dL (calc) (ref 1.9–3.7)
Glucose, Bld: 81 mg/dL (ref 65–99)
Potassium: 4.1 mmol/L (ref 3.5–5.3)
Sodium: 142 mmol/L (ref 135–146)
Total Bilirubin: 0.4 mg/dL (ref 0.2–1.2)
Total Protein: 7.3 g/dL (ref 6.1–8.1)

## 2018-12-30 LAB — COMPREHENSIVE METABOLIC PANEL WITH GFR
BUN: 21 mg/dL (ref 7–25)
Creat: 0.96 mg/dL (ref 0.60–1.35)

## 2018-12-30 LAB — HEMOGLOBIN A1C
Hgb A1c MFr Bld: 6.3 % of total Hgb — ABNORMAL HIGH (ref <5.7)
Mean Plasma Glucose: 134 (calc)
eAG (mmol/L): 7.4 (calc)

## 2018-12-30 LAB — LIPID PANEL W/REFLEX DIRECT LDL
Cholesterol: 157 mg/dL (ref <200)
HDL: 42 mg/dL (ref >40)
LDL Cholesterol (Calc): 97 mg/dL (calc)
Non-HDL Cholesterol (Calc): 115 mg/dL (ref <130)
Total CHOL/HDL Ratio: 3.7 (calc) (ref <5.0)
Triglycerides: 88 mg/dL (ref <150)

## 2018-12-30 LAB — TSH: TSH: 0.87 mIU/L (ref 0.40–4.50)

## 2019-01-01 ENCOUNTER — Encounter: Payer: Self-pay | Admitting: Sports Medicine

## 2019-02-03 ENCOUNTER — Other Ambulatory Visit: Payer: Self-pay | Admitting: Sports Medicine

## 2019-02-14 ENCOUNTER — Encounter: Payer: Self-pay | Admitting: Sports Medicine

## 2019-03-12 ENCOUNTER — Encounter: Payer: Self-pay | Admitting: Sports Medicine

## 2019-03-22 ENCOUNTER — Other Ambulatory Visit: Payer: Self-pay | Admitting: Sports Medicine

## 2019-03-22 DIAGNOSIS — E1142 Type 2 diabetes mellitus with diabetic polyneuropathy: Secondary | ICD-10-CM

## 2019-03-27 ENCOUNTER — Encounter (INDEPENDENT_AMBULATORY_CARE_PROVIDER_SITE_OTHER): Payer: BLUE CROSS/BLUE SHIELD | Admitting: Sports Medicine

## 2019-03-27 ENCOUNTER — Encounter: Payer: Self-pay | Admitting: Sports Medicine

## 2019-03-27 DIAGNOSIS — R238 Other skin changes: Secondary | ICD-10-CM | POA: Diagnosis not present

## 2019-03-28 DIAGNOSIS — R238 Other skin changes: Secondary | ICD-10-CM | POA: Insufficient documentation

## 2019-03-28 NOTE — Assessment & Plan Note (Signed)
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 I have not seen him in the office so I do not know if he has a positive or negative Nikolsky sign. I would like to do a biopsy for further evaluation.

## 2019-03-28 NOTE — Telephone Encounter (Signed)
I spent 5 total minutes of online digital evaluation and management services. 

## 2019-04-26 ENCOUNTER — Other Ambulatory Visit: Payer: Self-pay | Admitting: Sports Medicine

## 2019-04-26 DIAGNOSIS — E785 Hyperlipidemia, unspecified: Secondary | ICD-10-CM

## 2019-05-01 ENCOUNTER — Other Ambulatory Visit: Payer: Self-pay | Admitting: Sports Medicine

## 2019-05-01 DIAGNOSIS — G609 Hereditary and idiopathic neuropathy, unspecified: Secondary | ICD-10-CM

## 2019-09-10 ENCOUNTER — Other Ambulatory Visit: Payer: Self-pay | Admitting: Sports Medicine

## 2019-09-10 MED ORDER — LISINOPRIL-HYDROCHLOROTHIAZIDE 20-25 MG PO TABS: 1.0000 | ORAL_TABLET | Freq: Every day | ORAL | 3 refills | Status: AC

## 2019-11-05 ENCOUNTER — Other Ambulatory Visit: Payer: Self-pay | Admitting: Sports Medicine

## 2019-11-05 DIAGNOSIS — G609 Hereditary and idiopathic neuropathy, unspecified: Secondary | ICD-10-CM

## 2019-11-05 MED ORDER — DULOXETINE HCL 60 MG PO CPEP: 60.0000 mg | ORAL_CAPSULE | Freq: Every day | ORAL | 3 refills | Status: AC

## 2019-11-21 ENCOUNTER — Telehealth: Payer: Self-pay

## 2019-11-21 NOTE — Telephone Encounter (Signed)
Pt states he moved to [place], [place] in August and is wanting to know if Dr. Darene Lamer knows of any good doctors in that area. He requested a Rx refill of duloxetine and was told that the refill was sent in on 11/05/2019 and that he was due for a f/up next month. Pt states if Dr. Darene Lamer did not know of anyone in his area that he would drive two hours here and then back for appts but wants to see if Dr. Darene Lamer knows of anyone he recommends in the [place] area first.

## 2019-11-21 NOTE — Telephone Encounter (Signed)
Gosh, no I don't.  Have him just look at reviews of the doctors and pick someone there.  [place]h is a long drive.

## 2019-11-21 NOTE — Telephone Encounter (Signed)
Pt aware and asked if Dr. Darene Lamer knew of anyone in the [place] area. Spoke with Dr. Darene Lamer who recommended Dr. Conni Slipper in [place]. Info given to pt.

## 2019-12-14 ENCOUNTER — Other Ambulatory Visit: Payer: Self-pay

## 2019-12-14 MED ORDER — JENTADUETO XR 5-1000 MG PO TB24: 1.0000 | ORAL_TABLET | Freq: Every day | ORAL | 11 refills | Status: AC

## 2019-12-21 DIAGNOSIS — M6788 Other specified disorders of synovium and tendon, other site: Secondary | ICD-10-CM | POA: Diagnosis not present

## 2019-12-21 DIAGNOSIS — S8982XA Other specified injuries of left lower leg, initial encounter: Secondary | ICD-10-CM | POA: Diagnosis not present

## 2020-01-01 DIAGNOSIS — I10 Essential (primary) hypertension: Secondary | ICD-10-CM | POA: Diagnosis not present

## 2020-01-01 DIAGNOSIS — E1142 Type 2 diabetes mellitus with diabetic polyneuropathy: Secondary | ICD-10-CM | POA: Diagnosis not present

## 2020-01-01 DIAGNOSIS — M79672 Pain in left foot: Secondary | ICD-10-CM | POA: Diagnosis not present

## 2020-01-01 DIAGNOSIS — Z6828 Body mass index (BMI) 28.0-28.9, adult: Secondary | ICD-10-CM | POA: Diagnosis not present

## 2020-01-08 DIAGNOSIS — M79672 Pain in left foot: Secondary | ICD-10-CM | POA: Diagnosis not present

## 2020-01-08 DIAGNOSIS — G5793 Unspecified mononeuropathy of bilateral lower limbs: Secondary | ICD-10-CM | POA: Diagnosis not present

## 2020-01-08 DIAGNOSIS — M25372 Other instability, left ankle: Secondary | ICD-10-CM | POA: Diagnosis not present

## 2020-02-15 DIAGNOSIS — Z3009 Encounter for other general counseling and advice on contraception: Secondary | ICD-10-CM | POA: Diagnosis not present

## 2020-02-15 DIAGNOSIS — Z23 Encounter for immunization: Secondary | ICD-10-CM | POA: Diagnosis not present

## 2020-03-07 DIAGNOSIS — Z302 Encounter for sterilization: Secondary | ICD-10-CM | POA: Diagnosis not present

## 2020-03-14 DIAGNOSIS — Z23 Encounter for immunization: Secondary | ICD-10-CM | POA: Diagnosis not present

## 2020-04-04 DIAGNOSIS — Z0000 Encounter for general adult medical examination without abnormal findings: Secondary | ICD-10-CM | POA: Diagnosis not present

## 2020-04-04 DIAGNOSIS — E119 Type 2 diabetes mellitus without complications: Secondary | ICD-10-CM | POA: Diagnosis not present

## 2020-04-04 DIAGNOSIS — Z6828 Body mass index (BMI) 28.0-28.9, adult: Secondary | ICD-10-CM | POA: Diagnosis not present

## 2020-04-04 DIAGNOSIS — E1165 Type 2 diabetes mellitus with hyperglycemia: Secondary | ICD-10-CM | POA: Diagnosis not present

## 2020-05-28 DIAGNOSIS — Z6828 Body mass index (BMI) 28.0-28.9, adult: Secondary | ICD-10-CM | POA: Diagnosis not present

## 2020-05-28 DIAGNOSIS — S9000XA Contusion of unspecified ankle, initial encounter: Secondary | ICD-10-CM | POA: Diagnosis not present

## 2020-06-06 DIAGNOSIS — Z302 Encounter for sterilization: Secondary | ICD-10-CM | POA: Diagnosis not present

## 2020-09-02 ENCOUNTER — Other Ambulatory Visit: Payer: Self-pay | Admitting: Sports Medicine

## 2021-05-20 ENCOUNTER — Encounter: Payer: Self-pay | Admitting: Sports Medicine
